# Patient Record
Sex: Male | Born: 1995 | Race: White | Hispanic: No | Marital: Married | Smoking: Never smoker
Health system: Southern US, Community
[De-identification: ages and names within clinical notes are randomized; demographics above are authoritative.]

## PROBLEM LIST (undated history)

## (undated) DIAGNOSIS — Z8659 Personal history of other mental and behavioral disorders: Secondary | ICD-10-CM

## (undated) DIAGNOSIS — E663 Overweight: Secondary | ICD-10-CM

## (undated) HISTORY — DX: Overweight: E66.3

## (undated) HISTORY — DX: Personal history of other mental and behavioral disorders: Z86.59

---
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
## 2014-09-21 ENCOUNTER — Ambulatory Visit: Payer: Self-pay | Admitting: Unknown Physician Specialty

## 2014-09-23 ENCOUNTER — Emergency Department: Payer: Self-pay | Admitting: Emergency Medicine

## 2014-09-23 LAB — CBC WITH DIFFERENTIAL/PLATELET
BASOS ABS: 0 10*3/uL (ref 0.0–0.1)
BASOS PCT: 0.1 %
EOS ABS: 0 10*3/uL (ref 0.0–0.7)
Eosinophil %: 0.1 %
HCT: 49 % (ref 40.0–52.0)
HGB: 16.4 g/dL (ref 13.0–18.0)
LYMPHS ABS: 2.1 10*3/uL (ref 1.0–3.6)
Lymphocyte %: 15.1 %
MCH: 29.9 pg (ref 26.0–34.0)
MCHC: 33.4 g/dL (ref 32.0–36.0)
MCV: 89 fL (ref 80–100)
Monocyte #: 1.4 x10 3/mm — ABNORMAL HIGH (ref 0.2–1.0)
Monocyte %: 10.2 %
NEUTROS ABS: 10.5 10*3/uL — AB (ref 1.4–6.5)
Neutrophil %: 74.5 %
PLATELETS: 290 10*3/uL (ref 150–440)
RBC: 5.49 10*6/uL (ref 4.40–5.90)
RDW: 13.1 % (ref 11.5–14.5)
WBC: 14.2 10*3/uL — ABNORMAL HIGH (ref 3.8–10.6)

## 2014-09-23 LAB — COMPREHENSIVE METABOLIC PANEL
ALBUMIN: 4.2 g/dL (ref 3.8–5.6)
ALT: 29 U/L
Alkaline Phosphatase: 64 U/L
Anion Gap: 5 — ABNORMAL LOW (ref 7–16)
BILIRUBIN TOTAL: 2.6 mg/dL — AB (ref 0.2–1.0)
BUN: 9 mg/dL (ref 7–18)
CO2: 31 mmol/L (ref 21–32)
CREATININE: 1.1 mg/dL (ref 0.60–1.30)
Calcium, Total: 9.2 mg/dL (ref 9.0–10.7)
Chloride: 101 mmol/L (ref 98–107)
EGFR (African American): >60
EGFR (Non-African Amer.): >60
Glucose: 89 mg/dL (ref 65–99)
Osmolality: 272 (ref 275–301)
POTASSIUM: 3.8 mmol/L (ref 3.5–5.1)
SGOT(AST): 24 U/L (ref 10–41)
Sodium: 137 mmol/L (ref 136–145)
Total Protein: 8.1 g/dL (ref 6.4–8.6)

## 2015-01-18 LAB — SURGICAL PATHOLOGY

## 2015-05-25 DIAGNOSIS — F988 Other specified behavioral and emotional disorders with onset usually occurring in childhood and adolescence: Secondary | ICD-10-CM | POA: Insufficient documentation

## 2019-05-05 ENCOUNTER — Ambulatory Visit: Payer: Self-pay | Admitting: Internal Medicine

## 2019-05-13 ENCOUNTER — Encounter: Payer: Self-pay | Admitting: Internal Medicine

## 2019-05-13 ENCOUNTER — Ambulatory Visit: Payer: 59 | Admitting: Internal Medicine

## 2019-05-13 ENCOUNTER — Other Ambulatory Visit: Payer: Self-pay

## 2019-05-13 VITALS — BP 122/74 | HR 72 | Temp 97.8°F | Resp 16 | Ht 70.0 in | Wt 253.0 lb

## 2019-05-13 DIAGNOSIS — B356 Tinea cruris: Secondary | ICD-10-CM

## 2019-05-13 DIAGNOSIS — R21 Rash and other nonspecific skin eruption: Secondary | ICD-10-CM | POA: Insufficient documentation

## 2019-05-13 MED ORDER — CLOTRIMAZOLE-BETAMETHASONE 1-0.05 % EX CREA: 1.0000 "application " | TOPICAL_CREAM | Freq: Two times a day (BID) | CUTANEOUS | 1 refills | Status: DC

## 2019-05-13 NOTE — Progress Notes (Signed)
S - Patient presents with skin rash, groin area Started 2 weeks ago, was getting better, and then worsened again last few days Redness noted on upper inner thighs and groin Itchy and more recently burns some and stings No doc'ed fevers, has not felt feverish, not feeling ill and no Covid concerning sx's Has applied many products- gold bond, tinactin, a steroid cream  No Known Allergies   No current outpatient medications on file prior to visit.   No current facility-administered medications on file prior to visit.     Former tob user    O - NAD, not ill appearing, masked   BP 122/74 (BP Location: Right Arm, Patient Position: Sitting, Cuff Size: Large)   Pulse 72   Temp 97.8 F (36.6 C) (Oral)   Resp 16   Ht 5\' 10"  (1.778 m)   Wt 253 lb (114.8 kg)   SpO2 98%   BMI 36.30 kg/m    HEENT - sclera ancteric,  Skin - erythematous lesions noted on both upper thighs and into the groin andtowards the lower scrotum with mild involvement of the base of the scrotum, with the erythema mostly confluent and some satellite lesions noted on the left thigh> right thigh, + scaliness  Affect not flat, approp with conversation,      Ass/Plan - 1. Dermatitis - Tinea cruris with inflammation  Educated, info included in AVS Lotrisone generic - apply topically twice daily sparingly to clean areas after showering  Noted will take weeks not days to fully resolve and he understood  Loose fitting boxers best, avoid compression shorts Liberal use of powders as improving recommended regularly to help prevent recurrences Follow-up if not better or worsening,

## 2019-05-13 NOTE — Progress Notes (Signed)
Rash 2 weeks.  Tried Corizone cream, Gold Bond, Tinactin & Lotrimin Ultra without relief.  States got worse when tried Lotrimin Ultra.  States it burns - even when showers like a sunburn.

## 2019-05-13 NOTE — Patient Instructions (Signed)
Jock Itch Jock itch (tinea cruris) is an infection of the skin in the groin area. It is caused by a fungus, which is a type of germ that lives in dark, damp places. Jock itch causes an itchy rash in the groin and upper thigh area. It usually goes away in 2-3 weeks with treatment. What are the causes? The fungus that causes jock itch may be spread by:  Touching a fungus infection elsewhere on your body, such as athlete's foot, and then touching your groin area.  Sharing towels or clothing, such as socks or shoes, with someone who has a fungal infection. What increases the risk? Jock itch is most common in men and adolescent boys. You are also more likely to develop the condition if you:  Are in a hot, humid climate.  Wear tight-fitting clothing or wet bathing suits for long periods of time.  Play sports.  Are overweight.  Have diabetes.  Have a weakened immune system.  Sweat a lot. What are the signs or symptoms? Symptoms of jock itch may include:  A red, pink, or brown rash in the groin area. Blisters may be present. The rash may spread to the thighs, anus, and buttocks.  Dry and scaly skin on or around the rash.  Itchiness. How is this diagnosed? In most cases, your health care provider can make the diagnosis by looking at your rash. In some cases, a sample of infected skin may be scraped off. This sample may be examined under a microscope (biopsy) or by trying to grow the fungus from the sample (culture). How is this treated? Treatment for this condition may include:  Antifungal medicine to kill the fungus. This may be a skin cream, ointment, or powder, or it may be a medicine that you take by mouth.  Skin cream or ointment to reduce itching.  Lifestyle changes, such as wearing looser clothing and caring for your skin. Follow these instructions at home: Skin care  Apply skin creams, ointments, or powders exactly as told by your health care provider.  Wear  loose-fitting clothing that does not rub against your groin area. Men should wear boxer shorts or loose-fitting underwear.  Keep your groin area clean and dry. ? Change your underwear every day. ? Change out of wet bathing suits as soon as possible. ? After bathing, use a separate towel to dry your groin area thoroughly and gently. Using a separate towel will help prevent spreading the infection to other areas of your body.  Avoid hot baths and showers. Hot water can make itching worse.  Do not scratch the affected area. General instructions  Take and apply over-the-counter and prescription medicines only as told by your health care provider.  Do not share towels, clothing, or personal items with other people.  Wash your hands often with soap and water, especially after touching your groin area. If soap and water are not available, use alcohol-based hand sanitizer. Contact a health care provider if:  Your rash: ? Gets worse or does not get better after 2 weeks of treatment. ? Spreads. ? Returns after treatment is finished.  You have any of the following: ? A fever. ? New or worsening redness, swelling, or pain around your rash. ? Fluid, blood, or pus coming from your rash. Summary  Jock itch (tinea cruris) is a fungal infection of the skin in the groin area.  The fungus can be spread by sharing clothing or by touching a fungus infection elsewhere on your body and   then touching your groin area.  Treatment may include antifungal medicine and lifestyle changes, such as keeping the area clean and dry. This information is not intended to replace advice given to you by your health care provider. Make sure you discuss any questions you have with your health care provider. Document Released: 09/01/2002 Document Revised: 08/24/2017 Document Reviewed: 08/22/2017 Elsevier Patient Education  2020 Elsevier Inc[employer]  

## 2019-11-26 DIAGNOSIS — J069 Acute upper respiratory infection, unspecified: Secondary | ICD-10-CM | POA: Diagnosis not present

## 2019-11-26 DIAGNOSIS — J0110 Acute frontal sinusitis, unspecified: Secondary | ICD-10-CM | POA: Diagnosis not present

## 2019-11-26 DIAGNOSIS — Z20828 Contact with and (suspected) exposure to other viral communicable diseases: Secondary | ICD-10-CM | POA: Diagnosis not present

## 2019-11-26 DIAGNOSIS — Z03818 Encounter for observation for suspected exposure to other biological agents ruled out: Secondary | ICD-10-CM | POA: Diagnosis not present

## 2020-02-17 NOTE — Progress Notes (Signed)
Scheduled to complete physical 02/26/20.  (Provider TBD)  AMD 

## 2020-02-18 ENCOUNTER — Other Ambulatory Visit: Payer: Self-pay

## 2020-02-18 ENCOUNTER — Ambulatory Visit: Payer: Self-pay

## 2020-02-18 DIAGNOSIS — Z0000 Encounter for general adult medical examination without abnormal findings: Secondary | ICD-10-CM

## 2020-02-18 LAB — POCT URINALYSIS DIPSTICK
Bilirubin, UA: NEGATIVE
Blood, UA: NEGATIVE
Glucose, UA: NEGATIVE
Ketones, UA: NEGATIVE
Leukocytes, UA: NEGATIVE
Nitrite, UA: NEGATIVE
Protein, UA: NEGATIVE
Spec Grav, UA: 1.025 (ref 1.010–1.025)
Urobilinogen, UA: 0.2 E.U./dL
pH, UA: 6 (ref 5.0–8.0)

## 2020-02-19 LAB — CMP12+LP+TP+TSH+6AC+CBC/D/PLT
ALT: 17 IU/L (ref 0–44)
AST: 17 IU/L (ref 0–40)
Albumin/Globulin Ratio: 1.9 (ref 1.2–2.2)
Albumin: 4.6 g/dL (ref 4.1–5.2)
Alkaline Phosphatase: 55 IU/L (ref 48–121)
BUN/Creatinine Ratio: 17 (ref 9–20)
BUN: 14 mg/dL (ref 6–20)
Basophils Absolute: 0 10*3/uL (ref 0.0–0.2)
Basos: 1 %
Bilirubin Total: 0.6 mg/dL (ref 0.0–1.2)
Calcium: 9.4 mg/dL (ref 8.7–10.2)
Chloride: 103 mmol/L (ref 96–106)
Chol/HDL Ratio: 3.3 ratio (ref 0.0–5.0)
Cholesterol, Total: 137 mg/dL (ref 100–199)
Creatinine, Ser: 0.83 mg/dL (ref 0.76–1.27)
EOS (ABSOLUTE): 0.1 10*3/uL (ref 0.0–0.4)
Eos: 1 %
Estimated CHD Risk: <0.5 times avg. (ref 0.0–1.0)
Free Thyroxine Index: 2.3 (ref 1.2–4.9)
GFR calc Af Amer: 141 mL/min/{1.73_m2} (ref >59)
GFR calc non Af Amer: 122 mL/min/{1.73_m2} (ref >59)
GGT: 10 IU/L (ref 0–65)
Globulin, Total: 2.4 g/dL (ref 1.5–4.5)
Glucose: 89 mg/dL (ref 65–99)
HDL: 42 mg/dL (ref >39)
Hematocrit: 48.8 % (ref 37.5–51.0)
Hemoglobin: 16.4 g/dL (ref 13.0–17.7)
Immature Grans (Abs): 0 10*3/uL (ref 0.0–0.1)
Immature Granulocytes: 0 %
Iron: 80 ug/dL (ref 38–169)
LDH: 166 IU/L (ref 121–224)
LDL Chol Calc (NIH): 79 mg/dL (ref 0–99)
Lymphocytes Absolute: 1.9 10*3/uL (ref 0.7–3.1)
Lymphs: 30 %
MCH: 29.7 pg (ref 26.6–33.0)
MCHC: 33.6 g/dL (ref 31.5–35.7)
MCV: 88 fL (ref 79–97)
Monocytes Absolute: 0.7 10*3/uL (ref 0.1–0.9)
Monocytes: 11 %
Neutrophils Absolute: 3.6 10*3/uL (ref 1.4–7.0)
Neutrophils: 57 %
Phosphorus: 3 mg/dL (ref 2.8–4.1)
Platelets: 313 10*3/uL (ref 150–450)
Potassium: 4.7 mmol/L (ref 3.5–5.2)
RBC: 5.52 x10E6/uL (ref 4.14–5.80)
RDW: 12.4 % (ref 11.6–15.4)
Sodium: 139 mmol/L (ref 134–144)
T3 Uptake Ratio: 34 % (ref 24–39)
T4, Total: 6.7 ug/dL (ref 4.5–12.0)
TSH: 2.39 u[IU]/mL (ref 0.450–4.500)
Total Protein: 7 g/dL (ref 6.0–8.5)
Triglycerides: 81 mg/dL (ref 0–149)
Uric Acid: 5.4 mg/dL (ref 3.8–8.4)
VLDL Cholesterol Cal: 16 mg/dL (ref 5–40)
WBC: 6.3 10*3/uL (ref 3.4–10.8)

## 2020-02-26 ENCOUNTER — Other Ambulatory Visit: Payer: Self-pay

## 2020-02-26 ENCOUNTER — Encounter: Payer: Self-pay | Admitting: Physician Assistant

## 2020-02-26 ENCOUNTER — Ambulatory Visit: Payer: Self-pay | Admitting: Physician Assistant

## 2020-02-26 VITALS — BP 116/70 | HR 70 | Temp 97.3°F | Resp 12 | Ht 70.0 in | Wt 249.0 lb

## 2020-02-26 DIAGNOSIS — B079 Viral wart, unspecified: Secondary | ICD-10-CM

## 2020-02-26 DIAGNOSIS — Z0000 Encounter for general adult medical examination without abnormal findings: Secondary | ICD-10-CM

## 2020-02-26 NOTE — Progress Notes (Signed)
   Subjective: Annual physical    Patient ID: Russell Jacobs, male    DOB: [birth year], 24 y.o.   MRN: [id number]  HPI Patient presents for annual physical.  Patient was concerned secondary to increase in amount of warts to bilateral hand.   Review of Systems    Eczema and warts. Objective:   Physical Exam No acute distress.  HEENT was unremarkable.  Neck is supple for adenopathy or bruits.  Lungs are clear to auscultation.  Heart regular rate and rhythm.  Abdomen with negative HSM, normoactive bowel sounds, soft, nontender to palpation.  No obvious deformity to the upper extremity or lower extremities.  Patient has full and equal range of motion to the upper and lower extremities.  No obvious deformity to cervical lumbar spine.  Patient has full neck range of motion cervical lumbar spine.  Patient has multiple warts bilateral hand.  Cranial nerves II through XII are grossly intact.       Assessment & Plan: Well exam.  Discussed no acute findings on lab results with patient.  Recommend consult to dermatology for further evaluation and treatment of warts.

## 2020-02-26 NOTE — Addendum Note (Signed)
Addended by: Gardner Candle on: 02/26/2020 03:22 PM   Modules accepted: Orders

## 2020-03-02 DIAGNOSIS — B078 Other viral warts: Secondary | ICD-10-CM | POA: Diagnosis not present

## 2020-03-02 DIAGNOSIS — L308 Other specified dermatitis: Secondary | ICD-10-CM | POA: Diagnosis not present

## 2020-03-02 DIAGNOSIS — R238 Other skin changes: Secondary | ICD-10-CM | POA: Diagnosis not present

## 2020-03-02 DIAGNOSIS — R208 Other disturbances of skin sensation: Secondary | ICD-10-CM | POA: Diagnosis not present

## 2020-03-15 DIAGNOSIS — Z03818 Encounter for observation for suspected exposure to other biological agents ruled out: Secondary | ICD-10-CM | POA: Diagnosis not present

## 2020-03-15 DIAGNOSIS — R197 Diarrhea, unspecified: Secondary | ICD-10-CM | POA: Diagnosis not present

## 2020-03-15 DIAGNOSIS — R109 Unspecified abdominal pain: Secondary | ICD-10-CM | POA: Diagnosis not present

## 2020-03-15 DIAGNOSIS — W57XXXA Bitten or stung by nonvenomous insect and other nonvenomous arthropods, initial encounter: Secondary | ICD-10-CM | POA: Diagnosis not present

## 2020-03-15 DIAGNOSIS — R1084 Generalized abdominal pain: Secondary | ICD-10-CM | POA: Diagnosis not present

## 2020-03-16 DIAGNOSIS — R197 Diarrhea, unspecified: Secondary | ICD-10-CM | POA: Diagnosis not present

## 2020-03-16 DIAGNOSIS — R1084 Generalized abdominal pain: Secondary | ICD-10-CM | POA: Diagnosis not present

## 2020-04-26 DIAGNOSIS — B078 Other viral warts: Secondary | ICD-10-CM | POA: Diagnosis not present

## 2020-04-26 DIAGNOSIS — R238 Other skin changes: Secondary | ICD-10-CM | POA: Diagnosis not present

## 2020-04-27 ENCOUNTER — Other Ambulatory Visit: Payer: Self-pay

## 2020-04-27 ENCOUNTER — Encounter: Payer: Self-pay | Admitting: Emergency Medicine

## 2020-04-27 ENCOUNTER — Ambulatory Visit: Payer: Self-pay | Admitting: Emergency Medicine

## 2020-04-27 VITALS — BP 113/69 | HR 66 | Temp 96.5°F | Resp 14 | Ht 70.0 in | Wt 243.0 lb

## 2020-04-27 DIAGNOSIS — F909 Attention-deficit hyperactivity disorder, unspecified type: Secondary | ICD-10-CM

## 2020-04-27 NOTE — Progress Notes (Signed)
Occupational Health Provider Note       Time seen: 4:21 PM    I have reviewed the vital signs and the nursing notes.  HISTORY   Chief Complaint Drug Problem and ADD/ADHD History    HPI Russell Jacobs is a 24 y.o. male with a history of ADHD, obesity who presents today for a medication refill.  Patient has been off of Adderall for several years but is about to go back to school and is requesting it to help him concentrate.  Past Medical History:  Diagnosis Date  . History of ADHD    Stopped taking medication when went into [employer]  . Overweight     History reviewed. No pertinent surgical history.  Allergies Patient has no known allergies.   Review of Systems Constitutional: Negative for fever. Cardiovascular: Negative for chest pain. Respiratory: Negative for shortness of breath. Gastrointestinal: Negative for abdominal pain, vomiting and diarrhea. Musculoskeletal: Negative for back pain. Skin: Negative for rash. Neurological: Negative for headaches, focal weakness or numbness.   All systems negative/normal/unremarkable except as stated in the HPI  ____________________________________________   PHYSICAL EXAM:  VITAL SIGNS: Vitals:   04/27/20 1603  BP: 113/69  Pulse: 66  Resp: 14  Temp: (!) 96.5 F (35.8 C)  SpO2: 99%    Constitutional: Alert and oriented. Well appearing and in no distress. Eyes: Conjunctivae are normal. Normal extraocular movements. Cardiovascular: Normal rate, regular rhythm. No murmurs, rubs, or gallops. Respiratory: Normal respiratory effort without tachypnea nor retractions. Breath sounds are clear and equal bilaterally. No wheezes/rales/rhonchi. Musculoskeletal: Nontender with normal range of motion in extremities. No lower extremity tenderness nor edema. Neurologic:  Normal speech and language. No gross focal neurologic deficits are appreciated.  Skin:  Skin is warm, dry and intact. No rash noted. Psychiatric: Speech and  behavior are normal.  ____________________________________________   LABS (pertinent positives/negatives)  Recent Results (from the past 2160 hour(s))  CMP12+LP+TP+TSH+6AC+CBC/D/Plt     Status: None   Collection Time: 02/18/20  8:42 AM  Result Value Ref Range   Glucose 89 65 - 99 mg/dL   Uric Acid 5.4 3.8 - 8.4 mg/dL    Comment:            Therapeutic target for gout patients: <6.0   BUN 14 6 - 20 mg/dL   Creatinine, Ser 0.83 0.76 - 1.27 mg/dL   GFR calc non Af Amer 122 >59 mL/min/1.73   GFR calc Af Amer 141 >59 mL/min/1.73    Comment: **[facility] currently reports eGFR in compliance with the current**   recommendations of the [employer]. [facility] will   update reporting as new guidelines are published from the NKF-ASN   Task force.    BUN/Creatinine Ratio 17 9 - 20   Sodium 139 134 - 144 mmol/L   Potassium 4.7 3.5 - 5.2 mmol/L   Chloride 103 96 - 106 mmol/L   Calcium 9.4 8.7 - 10.2 mg/dL   Phosphorus 3.0 2.8 - 4.1 mg/dL   Total Protein 7.0 6.0 - 8.5 g/dL   Albumin 4.6 4.1 - 5.2 g/dL   Globulin, Total 2.4 1.5 - 4.5 g/dL   Albumin/Globulin Ratio 1.9 1.2 - 2.2   Bilirubin Total 0.6 0.0 - 1.2 mg/dL   Alkaline Phosphatase 55 48 - 121 IU/L    Comment:               **Please note reference interval change**   LDH 166 121 - 224 IU/L   AST 17 0 -  40 IU/L   ALT 17 0 - 44 IU/L   GGT 10 0 - 65 IU/L   Iron 80 38 - 169 ug/dL   Cholesterol, Total 137 100 - 199 mg/dL   Triglycerides 81 0 - 149 mg/dL   HDL 42 >39 mg/dL   VLDL Cholesterol Cal 16 5 - 40 mg/dL   LDL Chol Calc (NIH) 79 0 - 99 mg/dL   Chol/HDL Ratio 3.3 0.0 - 5.0 ratio    Comment:                                   T. Chol/HDL Ratio                                             Men  Women                               1/2 Avg.Risk  3.4    3.3                                   Avg.Risk  5.0    4.4                                2X Avg.Risk  9.6    7.1                                3X Avg.Risk 23.4   11.0     Estimated CHD Risk  < 0.5 0.0 - 1.0 times avg.    Comment: The CHD Risk is based on the T. Chol/HDL ratio. Other factors affect CHD Risk such as hypertension, smoking, diabetes, severe obesity, and family history of premature CHD.    TSH 2.390 0.450 - 4.500 uIU/mL   T4, Total 6.7 4.5 - 12.0 ug/dL   T3 Uptake Ratio 34 24 - 39 %   Free Thyroxine Index 2.3 1.2 - 4.9   WBC 6.3 3.4 - 10.8 x10E3/uL   RBC 5.52 4.14 - 5.80 x10E6/uL   Hemoglobin 16.4 13.0 - 17.7 g/dL   Hematocrit 48.8 37.5 - 51.0 %   MCV 88 79 - 97 fL   MCH 29.7 26.6 - 33.0 pg   MCHC 33.6 31 - 35 g/dL   RDW 12.4 11.6 - 15.4 %   Platelets 313 150 - 450 x10E3/uL   Neutrophils 57 Not Estab. %   Lymphs 30 Not Estab. %   Monocytes 11 Not Estab. %   Eos 1 Not Estab. %   Basos 1 Not Estab. %   Neutrophils Absolute 3.6 1 - 7 x10E3/uL   Lymphocytes Absolute 1.9 0 - 3 x10E3/uL   Monocytes Absolute 0.7 0 - 0 x10E3/uL   EOS (ABSOLUTE) 0.1 0.0 - 0.4 x10E3/uL   Basophils Absolute 0.0 0 - 0 x10E3/uL   Immature Granulocytes 0 Not Estab. %   Immature Grans (Abs) 0.0 0.0 - 0.1 x10E3/uL  POCT urinalysis dipstick     Status: None   Collection Time: 02/18/20 10:09 AM  Result Value Ref Range  Color, UA dark yellow    Clarity, UA clear    Glucose, UA Negative Negative   Bilirubin, UA negative    Ketones, UA negative    Spec Grav, UA 1.025 1.010 - 1.025   Blood, UA negative    pH, UA 6.0 5.0 - 8.0   Protein, UA Negative Negative   Urobilinogen, UA 0.2 0.2 or 1.0 E.U./dL   Nitrite, UA negative    Leukocytes, UA Negative Negative   Appearance     Odor      ASSESSMENT AND PLAN  ADHD   Plan: The patient had presented for Adderall refill.  Patient denies any complaints, I will start him on low-dose Adderall.  He is cleared for follow-up as needed.  Lenise Arena MD    Note: This note was generated in part or whole with voice recognition software. Voice recognition is usually quite accurate but there are transcription  errors that can and very often do occur. I apologize for any typographical errors that were not detected and corrected.

## 2020-04-28 ENCOUNTER — Telehealth: Payer: Self-pay | Admitting: Emergency Medicine

## 2020-04-28 NOTE — Telephone Encounter (Signed)
Hand written prescription

## 2020-05-21 ENCOUNTER — Other Ambulatory Visit: Payer: Self-pay

## 2020-05-21 DIAGNOSIS — F909 Attention-deficit hyperactivity disorder, unspecified type: Secondary | ICD-10-CM

## 2020-05-24 DIAGNOSIS — R208 Other disturbances of skin sensation: Secondary | ICD-10-CM | POA: Diagnosis not present

## 2020-05-24 DIAGNOSIS — B078 Other viral warts: Secondary | ICD-10-CM | POA: Diagnosis not present

## 2020-05-24 MED ORDER — ADDERALL XR 20 MG PO CP24: 20.0000 mg | ORAL_CAPSULE | Freq: Every day | ORAL | 0 refills | Status: DC

## 2020-07-02 ENCOUNTER — Other Ambulatory Visit: Payer: Self-pay

## 2020-07-02 DIAGNOSIS — F909 Attention-deficit hyperactivity disorder, unspecified type: Secondary | ICD-10-CM

## 2020-07-02 MED ORDER — ADDERALL XR 20 MG PO CP24: 20.0000 mg | ORAL_CAPSULE | Freq: Every day | ORAL | 0 refills | Status: DC

## 2020-07-05 DIAGNOSIS — B078 Other viral warts: Secondary | ICD-10-CM | POA: Diagnosis not present

## 2020-07-05 DIAGNOSIS — R238 Other skin changes: Secondary | ICD-10-CM | POA: Diagnosis not present

## 2020-07-05 DIAGNOSIS — L538 Other specified erythematous conditions: Secondary | ICD-10-CM | POA: Diagnosis not present

## 2020-07-12 DIAGNOSIS — G8929 Other chronic pain: Secondary | ICD-10-CM | POA: Diagnosis not present

## 2020-07-12 DIAGNOSIS — Z8379 Family history of other diseases of the digestive system: Secondary | ICD-10-CM | POA: Diagnosis not present

## 2020-07-12 DIAGNOSIS — R109 Unspecified abdominal pain: Secondary | ICD-10-CM | POA: Diagnosis not present

## 2020-07-12 DIAGNOSIS — Z8619 Personal history of other infectious and parasitic diseases: Secondary | ICD-10-CM | POA: Diagnosis not present

## 2020-07-12 DIAGNOSIS — K529 Noninfective gastroenteritis and colitis, unspecified: Secondary | ICD-10-CM | POA: Diagnosis not present

## 2020-07-13 DIAGNOSIS — K529 Noninfective gastroenteritis and colitis, unspecified: Secondary | ICD-10-CM | POA: Diagnosis not present

## 2020-07-13 DIAGNOSIS — R109 Unspecified abdominal pain: Secondary | ICD-10-CM | POA: Diagnosis not present

## 2020-07-13 DIAGNOSIS — G8929 Other chronic pain: Secondary | ICD-10-CM | POA: Diagnosis not present

## 2020-07-19 ENCOUNTER — Emergency Department: Payer: Self-pay

## 2020-07-19 ENCOUNTER — Other Ambulatory Visit: Payer: Self-pay

## 2020-07-19 ENCOUNTER — Ambulatory Visit: Payer: Self-pay | Admitting: Physician Assistant

## 2020-07-19 ENCOUNTER — Emergency Department
Admission: EM | Admit: 2020-07-19 | Discharge: 2020-07-19 | Disposition: A | Discharge status: Home / Self Care | Payer: No Typology Code available for payment source | Attending: Emergency Medicine | Admitting: Emergency Medicine

## 2020-07-19 ENCOUNTER — Emergency Department: Payer: No Typology Code available for payment source

## 2020-07-19 ENCOUNTER — Encounter: Payer: Self-pay | Admitting: Emergency Medicine

## 2020-07-19 VITALS — BP 119/84 | HR 82 | Temp 98.0°F | Resp 14 | Ht 70.0 in | Wt 228.0 lb

## 2020-07-19 DIAGNOSIS — W010XXA Fall on same level from slipping, tripping and stumbling without subsequent striking against object, initial encounter: Secondary | ICD-10-CM | POA: Diagnosis not present

## 2020-07-19 DIAGNOSIS — S93602A Unspecified sprain of left foot, initial encounter: Secondary | ICD-10-CM | POA: Diagnosis not present

## 2020-07-19 DIAGNOSIS — S9032XA Contusion of left foot, initial encounter: Secondary | ICD-10-CM | POA: Insufficient documentation

## 2020-07-19 DIAGNOSIS — Z87891 Personal history of nicotine dependence: Secondary | ICD-10-CM | POA: Insufficient documentation

## 2020-07-19 DIAGNOSIS — M79672 Pain in left foot: Secondary | ICD-10-CM | POA: Diagnosis present

## 2020-07-19 NOTE — Discharge Instructions (Signed)
Follow-up with orthopedics if not improving in 3 to 5 days.  Or orthopedics of Worker's Comp. choice.  Apply ice and elevate the foot.  Take over-the-counter Advil or Aleve.  You may also take Tylenol combined with these.  Return emergency department for worsening

## 2020-07-19 NOTE — ED Notes (Signed)
Workers comp completed using pt's DL. This tech logged the specimen in lab for transport.

## 2020-07-19 NOTE — Progress Notes (Signed)
   Subjective: Left foot pain    Patient ID: Russell Jacobs, male    DOB: 1996-02-19, 24 y.o.   MRN: [id number]  HPI Patient presents with left foot pain secondary to a "low-grade" accident which occurred this morning at work.  Patient pain increased with weightbearing and he cannot "curl" his toes secondary to the injury.  Denies loss of sensation.   Review of Systems    ADHD Objective:   Physical Exam No acute distress.  Examination of foot shows no obvious deformity.  There is mild edema to the lateral aspect of the left foot.  Patient is moderate guarding palpation at the fourth and fifth metatarsal.       Assessment & Plan: Foot pain  Advised the patient different evaluation with an x-ray is warranted.  Patient to go to the emergency room for definitive evaluation and treatment.

## 2020-07-19 NOTE — Progress Notes (Signed)
Pt presents today with an accident at work with rolling his left foot. Pt states top of foot hurts and cant curl his toes under. CL,RMA

## 2020-07-19 NOTE — ED Notes (Signed)
Pt with bruising top of left foot from injury. Pain when walking.

## 2020-07-19 NOTE — ED Provider Notes (Signed)
[facility] Emergency Department Provider Note  ____________________________________________   First MD Initiated Contact with Patient 07/19/20 1632     (approximate)  I have reviewed the triage vital signs and the nursing notes.   HISTORY  Chief Complaint Foot Pain    HPI Russell Jacobs is a 24 y.o. male presents emergency department complaining of left foot pain.  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  Injury happened today.  Swelling and bruising to the area near his toes.  States he does not have any ankle pain just foot pain.  No other injuries reported    Past Medical History:  Diagnosis Date  . History of ADHD    Stopped taking medication when went into [employer]  . Overweight     Patient Active Problem List   Diagnosis Date Noted  . Rash 05/13/2019  . ADD (attention deficit disorder) 05/25/2015    History reviewed. No pertinent surgical history.  Prior to Admission medications   Medication Sig Start Date End Date Taking? Authorizing Provider  ADDERALL XR 20 MG 24 hr capsule Take 1 capsule (20 mg total) by mouth daily. 07/02/20   Joni Reining, PA-C  clotrimazole-betamethasone (LOTRISONE) cream Apply 1 application topically 2 (two) times daily. Apply sparingly 05/13/19   Jamelle Haring, MD  fluorouracil (EFUDEX) 5 % cream  07/05/20   [provider]  fluticasone (FLONASE) 50 MCG/ACT nasal spray Place into the nose. 11/26/19   [provider]    Allergies Patient has no known allergies.  Family History  Problem Relation Age of Onset  . Crohn's disease Father     Social History Social History   Tobacco Use  . Smoking status: Former Games developer  . Smokeless tobacco: Former Clinical biochemist  . Vaping Use: Never used  Substance Use Topics  . Alcohol use: Not Currently  . Drug use: Not on file    Review of  Systems  Constitutional: No fever/chills Eyes: No visual changes. ENT: No sore throat. Respiratory: Denies cough Genitourinary: Negative for dysuria. Musculoskeletal: Negative for back pain.  Positive for left foot pain Skin: Negative for rash. Psychiatric: no mood changes,     ____________________________________________   PHYSICAL EXAM:  VITAL SIGNS: ED Triage Vitals  Enc Vitals Group     BP 07/19/20 1619 120/71     Pulse Rate 07/19/20 1619 87     Resp 07/19/20 1619 20     Temp 07/19/20 1619 99 F (37.2 C)     Temp Source 07/19/20 1619 Oral     SpO2 07/19/20 1619 99 %     Weight 07/19/20 1620 228 lb (103.4 kg)     Height 07/19/20 1620 5\' 10"  (1.778 m)     Head Circumference --      Peak Flow --      Pain Score 07/19/20 1619 4     Pain Loc --      Pain Edu? --      Excl. in GC? --     Constitutional: Alert and oriented. Well appearing and in no acute distress. Eyes: Conjunctivae are normal.  Head: Atraumatic. Nose: No congestion/rhinnorhea. Mouth/Throat: Mucous membranes are moist.   Neck:  supple no lymphadenopathy noted Cardiovascular: Normal rate, regular rhythm. Respiratory: Normal respiratory effort.  No retractions GU: deferred Musculoskeletal: FROM all extremities, warm and well perfused, left ankle is not tender,  left knee is not tender, left foot is tender distally along the distal metatarsals, minimal swelling noted, neurovascular is intact Neurologic:  Normal speech and language.  Skin:  Skin is warm, dry and intact. No rash noted. Psychiatric: Mood and affect are normal. Speech and behavior are normal.  ____________________________________________   LABS (all labs ordered are listed, but only abnormal results are displayed)  Labs Reviewed - No data to display ____________________________________________   ____________________________________________  RADIOLOGY  X-ray of the left  foot  ____________________________________________   PROCEDURES  Procedure(s) performed: Postop shoe applied by nursing staff   Procedures    ____________________________________________   INITIAL IMPRESSION / ASSESSMENT AND PLAN / ED COURSE  Pertinent labs & imaging results that were available during my care of the patient were reviewed by me and considered in my medical decision making (see chart for details).   Patient is 24 year old male presents emergency department Worker's Comp. injury of his left foot.  See HPI.  Physical exam shows the left foot to be tender at the distal portion of the metatarsals.  X-ray of the left foot to rule out fracture   I reviewed the x-ray, x-ray results are negative for fracture  Patiently placed in a postop shoe for comfort.  Light duty for 4 days.  Elevate and ice.  Take over-the-counter Advil and Tylenol.  Return if worsening.  Follow-up with orthopedics if not improving in 3 to 4 days  [name] was evaluated in Emergency Department on 07/19/2020 for the symptoms described in the history of present illness. He was evaluated in the context of the global COVID-19 pandemic, which necessitated consideration that the patient might be at risk for infection with the SARS-CoV-2 virus that causes COVID-19. Institutional protocols and algorithms that pertain to the evaluation of patients at risk for COVID-19 are in a state of rapid change based on information released by regulatory bodies including the CDC and federal and state organizations. These policies and algorithms were followed during the patient's care in the ED.    As part of my medical decision making, I reviewed the following data within the electronic MEDICAL RECORD NUMBER Nursing notes reviewed and incorporated, Old chart reviewed, Radiograph reviewed , Notes from prior ED visits and [place] Controlled Substance Database  ____________________________________________   FINAL CLINICAL  IMPRESSION(S) / ED DIAGNOSES  Final diagnoses:  Sprain of left foot, initial encounter      NEW MEDICATIONS STARTED DURING THIS VISIT:  New Prescriptions   No medications on file     Note:  This document was prepared using Dragon voice recognition software and may include unintentional dictation errors.    Faythe Ghee, PA-C 07/19/20 1751    Shaune Pollack, MD 07/20/20 0130

## 2020-07-19 NOTE — ED Triage Notes (Signed)
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 Pt ambulatory with steady gait at this time, c/o pain 4/10. Pt states is filing as a WC.

## 2020-07-22 ENCOUNTER — Ambulatory Visit: Payer: Self-pay | Admitting: Podiatry

## 2020-08-09 ENCOUNTER — Other Ambulatory Visit: Payer: Self-pay

## 2020-08-09 DIAGNOSIS — F909 Attention-deficit hyperactivity disorder, unspecified type: Secondary | ICD-10-CM

## 2020-08-12 ENCOUNTER — Telehealth: Payer: Self-pay

## 2020-08-12 NOTE — Telephone Encounter (Signed)
Order already in Epic for [employer], PA-C -- it was sent to him by Christianne Dolin, CMA.  AMD

## 2020-08-14 MED ORDER — ADDERALL XR 20 MG PO CP24: 20.0000 mg | ORAL_CAPSULE | Freq: Every day | ORAL | 0 refills | Status: DC

## 2020-09-06 ENCOUNTER — Other Ambulatory Visit: Payer: Self-pay

## 2020-09-06 DIAGNOSIS — Z1152 Encounter for screening for COVID-19: Secondary | ICD-10-CM

## 2020-09-08 LAB — NOVEL CORONAVIRUS, NAA: SARS-CoV-2, NAA: DETECTED — AB

## 2020-09-08 LAB — SARS-COV-2, NAA 2 DAY TAT

## 2020-09-21 ENCOUNTER — Other Ambulatory Visit: Payer: Self-pay

## 2020-09-21 DIAGNOSIS — F909 Attention-deficit hyperactivity disorder, unspecified type: Secondary | ICD-10-CM

## 2020-09-21 MED ORDER — ADDERALL XR 20 MG PO CP24: 20.0000 mg | ORAL_CAPSULE | Freq: Every day | ORAL | 0 refills | Status: DC

## 2020-11-03 ENCOUNTER — Telehealth: Payer: Self-pay

## 2020-11-03 NOTE — Telephone Encounter (Signed)
Needs to come in for appt to get refills of Adderall.  AMD

## 2020-11-05 DIAGNOSIS — B078 Other viral warts: Secondary | ICD-10-CM | POA: Diagnosis not present

## 2020-11-05 DIAGNOSIS — L538 Other specified erythematous conditions: Secondary | ICD-10-CM | POA: Diagnosis not present

## 2020-11-08 ENCOUNTER — Ambulatory Visit: Payer: 59 | Admitting: Adult Medicine

## 2020-11-08 DIAGNOSIS — Z760 Encounter for issue of repeat prescription: Secondary | ICD-10-CM

## 2020-11-09 ENCOUNTER — Ambulatory Visit: Payer: Self-pay | Admitting: Adult Medicine

## 2020-11-09 ENCOUNTER — Encounter: Payer: Self-pay | Admitting: Adult Medicine

## 2020-11-09 ENCOUNTER — Other Ambulatory Visit: Payer: Self-pay

## 2020-11-09 DIAGNOSIS — F909 Attention-deficit hyperactivity disorder, unspecified type: Secondary | ICD-10-CM

## 2020-11-09 MED ORDER — ADDERALL XR 20 MG PO CP24
20.0000 mg | ORAL_CAPSULE | Freq: Every day | ORAL | 0 refills | Status: DC
Start: 2020-11-09 — End: 2020-11-11

## 2020-11-09 NOTE — Patient Instructions (Addendum)
Attention Deficit Hyperactivity Disorder, Adult Attention deficit hyperactivity disorder (ADHD) is a mental health disorder that starts during childhood (neurodevelopmental disorder). For many people with ADHD, the disorder continues into the adult years. Treatment can help you manage your symptoms. What are the causes? The exact cause of ADHD is not known. Most experts believe genetics and environmental factors contribute to ADHD. What increases the risk? The following factors may make you more likely to develop this condition:  Having a family history of ADHD.  Being male.  Being born to a mother who smoked or drank alcohol during pregnancy.  Being exposed to lead or other toxins in the womb or early in life.  Being born before 37 weeks of pregnancy (prematurely) or at a low birth weight.  Having experienced a brain injury. What are the signs or symptoms? Symptoms of this condition depend on the type of ADHD. The two main types are inattentive and hyperactive-impulsive. Some people may have symptoms of both types. Symptoms of the inattentive type include:  Difficulty paying attention.  Making careless mistakes.  Not following instructions.  Being disorganized.  Avoiding tasks that require time and attention.  Losing and forgetting things.  Being easily distracted. Symptoms of the hyperactive-impulsive type include:  Restlessness.  Talking too much.  Interrupting.  Difficulty with: ? Sitting still. ? Feeling motivated. ? Relaxing. ? Waiting in line or waiting for a turn. In adults, this condition may lead to certain problems, such as:  Keeping jobs.  Performing tasks at work.  Having stable relationships.  Being on time or keeping to a schedule. How is this diagnosed? This condition is diagnosed based on your current symptoms and your history of symptoms. The diagnosis can be made by a health care provider such as a primary care provider or a mental health  care specialist. Your health care provider may use a symptom checklist or a behavior rating scale to evaluate your symptoms. He or she may also want to talk with people who have observed your behaviors throughout your life. How is this treated? This condition can be treated with medicines and behavior therapy. Medicines may be the best option to reduce impulsive behaviors and improve attention. Your health care provider may recommend:  Stimulant medicines. These are the most common medicines used for adult ADHD. They affect certain chemicals in the brain (neurotransmitters) and improve your ability to control your symptoms.  A non-stimulant medicine  was sought for adult ADHD (atomoxetine). This medicine increases a neurotransmitter called norepinephrine. It may take weeks to months to see effects from this medicine. Counseling and behavioral management are also important for treating ADHD. Counseling is often used along with medicine. Your health care provider may suggest:  Cognitive behavioral therapy (CBT). This type of therapy teaches you to replace negative thoughts and actions with positive thoughts and actions. When used as part of ADHD treatment, this therapy may also include: ? Coping strategies for organization, time management, impulse control, and stress reduction. ? Mindfulness and meditation training.  Behavioral management. You may work with a Psychologist, occupational who is specially trained to help people with ADHD manage and organize activities and function more effectively. Follow these instructions at home: Medicines  Take over-the-counter and prescription medicines only as told by your health care provider.  Talk with your health care provider about the possible side effects of your medicines and how to manage them.   Lifestyle  Do not use drugs.  Do not drink alcohol if: ? Your health care  provider tells you not to drink. ? You are pregnant, may be pregnant, or are planning to become  pregnant.  If you drink alcohol: ? Limit how much you use to:  0-1 drink a day for women.  0-2 drinks a day for men. ? Be aware of how much alcohol is in your drink. In the [place] one drink equals one 12 oz bottle of beer (355 mL), one 5 oz glass of wine (148 mL), or one 1 oz glass of hard liquor (44 mL).  Get enough sleep.  Eat a healthy diet.  Exercise regularly. Exercise can help to reduce stress and anxiety.   General instructions  Learn as much as you can about adult ADHD, and work closely with your health care providers to find the treatments that work best for you.  Follow the same schedule each day.  Use reminder devices like notes, calendars, and phone apps to stay on time and organized.  Keep all follow-up visits as told by your health care provider and therapist. This is important. Where to find more information A health care provider may be able to recommend resources that are available online or over the phone. You could start with:  Attention Deficit Disorder Association (ADDA): http://davis-dillon.net/  [employer] Mental Health [facility]): http://www.maynard.net/ Contact a health care provider if:  Your symptoms continue to cause problems.  You have side effects from your medicine, such as: ? Repeated muscle twitches, coughing, or speech outbursts. ? Sleep problems. ? Loss of appetite. ? Dizziness. ? Unusually fast heartbeat. ? Stomach pains. ? Headaches.  You are struggling with anxiety, depression, or substance abuse. Get help right away if you:  Have a severe reaction to a medicine. If you ever feel like you may hurt yourself or others, or have thoughts about taking your own life, get help right away. You can go to the nearest emergency department or call:  Your local emergency services (911 in the [place]  A suicide crisis helpline, such as the National Suicide Prevention Lifeline at (240)841-0497. This is open 24 hours a day. Summary  ADHD is a mental  health disorder that starts during childhood (neurodevelopmental disorder) and often continues into the adult years.  The exact cause of ADHD is not known. Most experts believe genetics and environmental factors contribute to ADHD.  There is no cure for ADHD, but treatment with medicine, cognitive behavioral therapy, or behavioral management can help you manage your condition. This information is not intended to replace advice given to you by your health care provider. Make sure you discuss any questions you have with your health care provider. Document Revised: 02/03/2019 Document Reviewed: 02/03/2019  Attention Deficit Hyperactivity Disorder, Adult Attention deficit hyperactivity disorder (ADHD) is a mental health disorder that starts during childhood (neurodevelopmental disorder). For many people with ADHD, the disorder continues into the adult years. Treatment can help you manage your symptoms. What are the causes? The exact cause of ADHD is not known. Most experts believe genetics and environmental factors contribute to ADHD. What increases the risk? The following factors may make you more likely to develop this condition:  Having a family history of ADHD.  Being male.  Being born to a mother who smoked or drank alcohol during pregnancy.  Being exposed to lead or other toxins in the womb or early in life.  Being born before 37 weeks of pregnancy (prematurely) or at a low birth weight.  Having experienced a brain injury. What are the signs or  symptoms? Symptoms of this condition depend on the type of ADHD. The two main types are inattentive and hyperactive-impulsive. Some people may have symptoms of both types. Symptoms of the inattentive type include:  Difficulty paying attention.  Making careless mistakes.  Not following instructions.  Being disorganized.  Avoiding tasks that require time and attention.  Losing and forgetting things.  Being easily distracted. Symptoms  of the hyperactive-impulsive type include:  Restlessness.  Talking too much.  Interrupting.  Difficulty with: ? Sitting still. ? Feeling motivated. ? Relaxing. ? Waiting in line or waiting for a turn. In adults, this condition may lead to certain problems, such as:  Keeping jobs.  Performing tasks at work.  Having stable relationships.  Being on time or keeping to a schedule. How is this diagnosed? This condition is diagnosed based on your current symptoms and your history of symptoms. The diagnosis can be made by a health care provider such as a primary care provider or a mental health care specialist. Your health care provider may use a symptom checklist or a behavior rating scale to evaluate your symptoms. He or she may also want to talk with people who have observed your behaviors throughout your life. How is this treated? This condition can be treated with medicines and behavior therapy. Medicines may be the best option to reduce impulsive behaviors and improve attention. Your health care provider may recommend:  Stimulant medicines. These are the most common medicines used for adult ADHD. They affect certain chemicals in the brain (neurotransmitters) and improve your ability to control your symptoms.  A non-stimulant medicine for adult ADHD (atomoxetine). This medicine increases a neurotransmitter called norepinephrine. It may take weeks to months to see effects from this medicine. Counseling and behavioral management are also important for treating ADHD. Counseling is often used along with medicine. Your health care provider may suggest:  Cognitive behavioral therapy (CBT). This type of therapy teaches you to replace negative thoughts and actions with positive thoughts and actions. When used as part of ADHD treatment, this therapy may also include: ? Coping strategies for organization, time management, impulse control, and stress reduction. ? Mindfulness and meditation  training.  Behavioral management. You may work with a Psychologist, occupational who is specially trained to help people with ADHD manage and organize activities and function more effectively. Follow these instructions at home: Medicines  Take over-the-counter and prescription medicines only as told by your health care provider.  Talk with your health care provider about the possible side effects of your medicines and how to manage them.   Lifestyle  Do not use drugs.  Do not drink alcohol if: ? Your health care provider tells you not to drink. ? You are pregnant, may be pregnant, or are planning to become pregnant.  If you drink alcohol: ? Limit how much you use to:  0-1 drink a day for women.  0-2 drinks a day for men. ? Be aware of how much alcohol is in your drink. In the [place] one drink equals one 12 oz bottle of beer (355 mL), one 5 oz glass of wine (148 mL), or one 1 oz glass of hard liquor (44 mL).  Get enough sleep.  Eat a healthy diet.  Exercise regularly. Exercise can help to reduce stress and anxiety.   General instructions  Learn as much as you can about adult ADHD, and work closely with your health care providers to find the treatments that work best for you.  Follow the same schedule  each day.  Use reminder devices like notes, calendars, and phone apps to stay on time and organized.  Keep all follow-up visits as told by your health care provider and therapist. This is important. Where to find more information A health care provider may be able to recommend resources that are available online or over the phone. You could start with:  Attention Deficit Disorder Association (ADDA): http://davis-dillon.net/www.add.org  [employer] Mental [facility]IMH): http://www.maynard.net/www.nimh.nih.gov Contact a health care provider if:  Your symptoms continue to cause problems.  You have side effects from your medicine, such as: ? Repeated muscle twitches, coughing, or speech outbursts. ? Sleep problems. ? Loss of  appetite. ? Dizziness. ? Unusually fast heartbeat. ? Stomach pains. ? Headaches.  You are struggling with anxiety, depression, or substance abuse. Get help right away if you:  Have a severe reaction to a medicine. If you ever feel like you may hurt yourself or others, or have thoughts about taking your own life, get help right away. You can go to the nearest emergency department or call:  Your local emergency services (911 in the [place]  A suicide crisis helpline, such as the National Suicide Prevention Lifeline at (947) 769-07551-6075281110. This is open 24 hours a day. Summary  ADHD is a mental health disorder that starts during childhood (neurodevelopmental disorder) and often continues into the adult years.  The exact cause of ADHD is not known. Most experts believe genetics and environmental factors contribute to ADHD.  There is no cure for ADHD, but treatment with medicine, cognitive behavioral therapy, or behavioral management can help you manage your condition. This information is not intended to replace advice given to you by your health care provider. Make sure you discuss any questions you have with your health care provider. Document Revised: 02/03/2019 Document Reviewed: 02/03/2019 Elsevier Patient Education  2021 [employer]

## 2020-11-11 ENCOUNTER — Other Ambulatory Visit: Payer: Self-pay | Admitting: Adult Medicine

## 2020-11-11 DIAGNOSIS — F909 Attention-deficit hyperactivity disorder, unspecified type: Secondary | ICD-10-CM

## 2020-11-11 MED ORDER — ADDERALL XR 20 MG PO CP24: 20.0000 mg | ORAL_CAPSULE | Freq: Every day | ORAL | 0 refills | Status: DC

## 2020-12-06 DIAGNOSIS — S93523A Sprain of metatarsophalangeal joint of unspecified great toe, initial encounter: Secondary | ICD-10-CM | POA: Insufficient documentation

## 2020-12-06 DIAGNOSIS — S9032XA Contusion of left foot, initial encounter: Secondary | ICD-10-CM | POA: Insufficient documentation

## 2020-12-06 DIAGNOSIS — M659 Synovitis and tenosynovitis, unspecified: Secondary | ICD-10-CM | POA: Insufficient documentation

## 2020-12-23 ENCOUNTER — Other Ambulatory Visit: Payer: Self-pay

## 2020-12-23 DIAGNOSIS — F909 Attention-deficit hyperactivity disorder, unspecified type: Secondary | ICD-10-CM

## 2020-12-23 MED ORDER — ADDERALL XR 20 MG PO CP24
20.0000 mg | ORAL_CAPSULE | Freq: Every day | ORAL | 0 refills | Status: DC
Start: 2020-12-23 — End: 2021-03-02

## 2020-12-28 DIAGNOSIS — B078 Other viral warts: Secondary | ICD-10-CM | POA: Diagnosis not present

## 2020-12-28 DIAGNOSIS — L538 Other specified erythematous conditions: Secondary | ICD-10-CM | POA: Diagnosis not present

## 2021-01-17 DIAGNOSIS — S93501A Unspecified sprain of right great toe, initial encounter: Secondary | ICD-10-CM | POA: Insufficient documentation

## 2021-02-02 ENCOUNTER — Other Ambulatory Visit: Payer: Self-pay
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
## 2021-02-17 ENCOUNTER — Emergency Department
Admission: EM | Admit: 2021-02-17 | Discharge: 2021-02-17 | Disposition: A | Discharge status: Home / Self Care | Payer: 59 | Attending: Emergency Medicine | Admitting: Emergency Medicine

## 2021-02-17 ENCOUNTER — Other Ambulatory Visit: Payer: Self-pay

## 2021-02-17 ENCOUNTER — Emergency Department: Payer: 59

## 2021-02-17 DIAGNOSIS — S0181XA Laceration without foreign body of other part of head, initial encounter: Secondary | ICD-10-CM | POA: Insufficient documentation

## 2021-02-17 DIAGNOSIS — Z87891 Personal history of nicotine dependence: Secondary | ICD-10-CM | POA: Insufficient documentation

## 2021-02-17 DIAGNOSIS — Y92410 Unspecified street and highway as the place of occurrence of the external cause: Secondary | ICD-10-CM | POA: Diagnosis not present

## 2021-02-17 DIAGNOSIS — S0990XA Unspecified injury of head, initial encounter: Secondary | ICD-10-CM

## 2021-02-17 DIAGNOSIS — Z23 Encounter for immunization: Secondary | ICD-10-CM | POA: Insufficient documentation

## 2021-02-17 DIAGNOSIS — M7989 Other specified soft tissue disorders: Secondary | ICD-10-CM | POA: Diagnosis not present

## 2021-02-17 MED ORDER — METHOCARBAMOL 500 MG PO TABS: 500.0000 mg | ORAL_TABLET | Freq: Three times a day (TID) | ORAL | 0 refills | Status: AC | PRN

## 2021-02-17 MED ORDER — TETANUS-DIPHTH-ACELL PERTUSSIS 5-2.5-18.5 LF-MCG/0.5 IM SUSY
0.5000 mL | PREFILLED_SYRINGE | Freq: Once | INTRAMUSCULAR | Status: AC
  Administered 2021-02-17: 0.5 mL via INTRAMUSCULAR
  Filled 2021-02-17: qty 0.5

## 2021-02-17 MED ORDER — MELOXICAM 15 MG PO TABS: 15.0000 mg | ORAL_TABLET | Freq: Every day | ORAL | 2 refills | Status: AC
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
## 2021-02-17 NOTE — Discharge Instructions (Signed)
Take Meloxicam and Robaxin as directed.  

## 2021-03-02 ENCOUNTER — Other Ambulatory Visit: Payer: Self-pay

## 2021-03-02 ENCOUNTER — Ambulatory Visit: Payer: Self-pay

## 2021-03-02 DIAGNOSIS — F909 Attention-deficit hyperactivity disorder, unspecified type: Secondary | ICD-10-CM

## 2021-03-02 DIAGNOSIS — Z0000 Encounter for general adult medical examination without abnormal findings: Secondary | ICD-10-CM

## 2021-03-02 LAB — POCT URINALYSIS DIPSTICK
Bilirubin, UA: NEGATIVE
Blood, UA: NEGATIVE
Glucose, UA: NEGATIVE
Ketones, UA: NEGATIVE
Leukocytes, UA: NEGATIVE
Nitrite, UA: NEGATIVE
Protein, UA: POSITIVE — AB
Spec Grav, UA: 1.015 (ref 1.010–1.025)
Urobilinogen, UA: 0.2 E.U./dL
pH, UA: 7.5 (ref 5.0–8.0)

## 2021-03-02 MED ORDER — ADDERALL XR 20 MG PO CP24: 20.0000 mg | ORAL_CAPSULE | Freq: Every day | ORAL | 0 refills | Status: DC

## 2021-03-02 NOTE — Progress Notes (Signed)
Pt scheduled to complete annual physical 03/10/21 with Mila Merry  Pt aware of Hep C and HIV screenings and denies both.  Pt denies the COVID-19 vaccine indefinite. CL,RMA

## 2021-03-03 LAB — CMP12+LP+TP+TSH+6AC+CBC/D/PLT
ALT: 16 IU/L (ref 0–44)
AST: 19 IU/L (ref 0–40)
Albumin/Globulin Ratio: 1.8 (ref 1.2–2.2)
Albumin: 4.4 g/dL (ref 4.1–5.2)
Alkaline Phosphatase: 66 IU/L (ref 44–121)
BUN/Creatinine Ratio: 12 (ref 9–20)
BUN: 12 mg/dL (ref 6–20)
Basophils Absolute: 0 10*3/uL (ref 0.0–0.2)
Basos: 1 %
Bilirubin Total: 1 mg/dL (ref 0.0–1.2)
Calcium: 9.7 mg/dL (ref 8.7–10.2)
Chloride: 100 mmol/L (ref 96–106)
Chol/HDL Ratio: 3.4 ratio (ref 0.0–5.0)
Cholesterol, Total: 167 mg/dL (ref 100–199)
Creatinine, Ser: 1 mg/dL (ref 0.76–1.27)
EOS (ABSOLUTE): 0.1 10*3/uL (ref 0.0–0.4)
Eos: 2 %
Estimated CHD Risk: 0.5 times avg. (ref 0.0–1.0)
Free Thyroxine Index: 2.1 (ref 1.2–4.9)
GGT: 9 IU/L (ref 0–65)
Globulin, Total: 2.5 g/dL (ref 1.5–4.5)
Glucose: 88 mg/dL (ref 65–99)
HDL: 49 mg/dL (ref >39)
Hematocrit: 46.6 % (ref 37.5–51.0)
Hemoglobin: 15.9 g/dL (ref 13.0–17.7)
Immature Grans (Abs): 0 10*3/uL (ref 0.0–0.1)
Immature Granulocytes: 0 %
Iron: 105 ug/dL (ref 38–169)
LDH: 202 IU/L (ref 121–224)
LDL Chol Calc (NIH): 106 mg/dL — ABNORMAL HIGH (ref 0–99)
Lymphocytes Absolute: 2.1 10*3/uL (ref 0.7–3.1)
Lymphs: 31 %
MCH: 29.6 pg (ref 26.6–33.0)
MCHC: 34.1 g/dL (ref 31.5–35.7)
MCV: 87 fL (ref 79–97)
Monocytes Absolute: 0.5 10*3/uL (ref 0.1–0.9)
Monocytes: 8 %
Neutrophils Absolute: 4 10*3/uL (ref 1.4–7.0)
Neutrophils: 58 %
Phosphorus: 3.2 mg/dL (ref 2.8–4.1)
Platelets: 353 10*3/uL (ref 150–450)
Potassium: 4.4 mmol/L (ref 3.5–5.2)
RBC: 5.38 x10E6/uL (ref 4.14–5.80)
RDW: 12.7 % (ref 11.6–15.4)
Sodium: 139 mmol/L (ref 134–144)
T3 Uptake Ratio: 32 % (ref 24–39)
T4, Total: 6.7 ug/dL (ref 4.5–12.0)
TSH: 1.99 u[IU]/mL (ref 0.450–4.500)
Total Protein: 6.9 g/dL (ref 6.0–8.5)
Triglycerides: 64 mg/dL (ref 0–149)
Uric Acid: 7.1 mg/dL (ref 3.8–8.4)
VLDL Cholesterol Cal: 12 mg/dL (ref 5–40)
WBC: 6.7 10*3/uL (ref 3.4–10.8)
eGFR: 106 mL/min/{1.73_m2} (ref >59)

## 2021-03-17 ENCOUNTER — Encounter: Payer: Self-pay | Admitting: Adult Health

## 2021-03-17 ENCOUNTER — Ambulatory Visit: Payer: Self-pay | Admitting: Adult Health

## 2021-03-17 ENCOUNTER — Other Ambulatory Visit: Payer: Self-pay

## 2021-03-17 VITALS — BP 115/73 | HR 64 | Temp 97.6°F | Resp 14 | Ht 70.0 in | Wt 240.0 lb

## 2021-03-17 DIAGNOSIS — Z0000 Encounter for general adult medical examination without abnormal findings: Secondary | ICD-10-CM

## 2021-03-17 DIAGNOSIS — F909 Attention-deficit hyperactivity disorder, unspecified type: Secondary | ICD-10-CM

## 2021-03-17 DIAGNOSIS — Z6834 Body mass index (BMI) 34.0-34.9, adult: Secondary | ICD-10-CM

## 2021-03-17 NOTE — Progress Notes (Signed)
[facility] [place], [place] [address]  Internal MEDICINE  Office Visit Note  Patient Name: Russell Jacobs  [id number]  [id number]  Date of Service: 03/17/2021  Chief Complaint  Patient presents with   Annual Exam     HPI Pt is here for routine health maintenance examination.  He is a well appearing 25 yo male who works in [employer].    He has been married for 4 years. He has a history of ADHD, for which he takes Adderall. He doesn't exercise regularly, but his coworkers have recently started s softball team.  So he is practicing that currently.  He does chew tobacco, about 1 lb bag per week.  Reports occasional alcohol use, denies illicit drug use.  He and his wife have been trying to get pregnant for the last 3 years. He is requesting testing, to make sure he is not infertile.    Current Medication: Outpatient Encounter Medications as of 03/17/2021  Medication Sig   ADDERALL XR 20 MG 24 hr capsule Take 1 capsule (20 mg total) by mouth daily.   clobetasol ointment (TEMOVATE) 0.05 % SMARTSIG:Sparingly Topical Twice Daily   clotrimazole-betamethasone (LOTRISONE) cream Apply 1 application topically 2 (two) times daily. Apply sparingly   fluorouracil (EFUDEX) 5 % cream    fluticasone (FLONASE) 50 MCG/ACT nasal spray Place into the nose.   meloxicam (MOBIC) 15 MG tablet Take 1 tablet (15 mg total) by mouth daily.   No facility-administered encounter medications on file as of 03/17/2021.    Surgical History: History reviewed. No pertinent surgical history.  Medical History: Past Medical History:  Diagnosis Date   History of ADHD    Stopped taking medication when went into [employer]   Overweight     Family History: Family History  Problem Relation Age of Onset   Crohn's disease Father     Social History: Social History   Socioeconomic History   Marital status: Married    Spouse name: Not on file   Number of children: Not on file    Years of education: Not on file   Highest education level: Not on file  Occupational History   Not on file  Tobacco Use   Smoking status: Former    Pack years: 0.00   Smokeless tobacco: Former  Scientific laboratory technician Use: Never used  Substance and Sexual Activity   Alcohol use: Not Currently   Drug use: Not on file   Sexual activity: Not on file  Other Topics Concern   Not on file  Social History Narrative   Not on file   Social Determinants of Health   Financial Resource Strain: Not on file  Food Insecurity: Not on file  Transportation Needs: Not on file  Physical Activity: Not on file  Stress: Not on file  Social Connections: Not on file      Review of Systems  Constitutional:  Negative for activity change, appetite change and fatigue.  HENT:  Negative for congestion, sinus pain, trouble swallowing and voice change.   Eyes:  Negative for pain, discharge and visual disturbance.  Respiratory:  Negative for cough, chest tightness and shortness of breath.   Cardiovascular:  Negative for chest pain and leg swelling.  Gastrointestinal:  Negative for abdominal distention, abdominal pain, constipation and diarrhea.  Genitourinary:  Negative for difficulty urinating, hematuria, penile discharge, scrotal swelling and testicular pain.  Musculoskeletal:  Negative for arthralgias, back pain and neck pain.  Skin:  Negative for color change.  Neurological:  Negative for dizziness, weakness and headaches.  Hematological:  Negative for adenopathy.  Psychiatric/Behavioral:  Negative for agitation, confusion and suicidal ideas.     Vital Signs: BP 115/73   Pulse 64   Temp 97.6 F (36.4 C)   Resp 14   Ht '5\' 10"'  (1.778 m)   Wt 240 lb (108.9 kg)   SpO2 98%   BMI 34.44 kg/m    Physical Exam Constitutional:      Appearance: Normal appearance.  HENT:     Head: Normocephalic.     Right Ear: Tympanic membrane normal.     Left Ear: Tympanic membrane normal.     Nose: Nose normal.      Mouth/Throat:     Mouth: Mucous membranes are moist.     Pharynx: No oropharyngeal exudate or posterior oropharyngeal erythema.  Eyes:     General:        Right eye: No discharge.        Left eye: No discharge.     Extraocular Movements: Extraocular movements intact.     Pupils: Pupils are equal, round, and reactive to light.  Cardiovascular:     Rate and Rhythm: Normal rate and regular rhythm.     Pulses: Normal pulses.     Heart sounds: Normal heart sounds. No murmur heard. Pulmonary:     Effort: Pulmonary effort is normal. No respiratory distress.     Breath sounds: Normal breath sounds. No wheezing or rhonchi.  Abdominal:     General: Abdomen is flat. Bowel sounds are normal. There is no distension.     Palpations: There is no mass.     Tenderness: There is no abdominal tenderness. There is no guarding.     Hernia: No hernia is present. There is no hernia in the left inguinal area or right inguinal area.  Genitourinary:    Pubic Area: No rash or pubic lice.      Penis: No phimosis, paraphimosis, hypospadias, erythema, tenderness, discharge, swelling or lesions.      Testes:        Right: Mass, tenderness or swelling not present.        Left: Mass, tenderness or swelling not present.     Epididymis:     Right: Not inflamed or enlarged. No mass or tenderness.     Left: Not inflamed or enlarged. No mass or tenderness.  Musculoskeletal:        General: No swelling or deformity. Normal range of motion.     Cervical back: Normal range of motion.  Lymphadenopathy:     Lower Body: No right inguinal adenopathy. No left inguinal adenopathy.  Skin:    General: Skin is warm and dry.     Capillary Refill: Capillary refill takes less than 2 seconds.  Neurological:     General: No focal deficit present.     Mental Status: He is alert.     Cranial Nerves: No cranial nerve deficit.     Gait: Gait normal.  Psychiatric:        Mood and Affect: Mood normal.        Behavior: Behavior  normal.        Judgment: Judgment normal.     LABS: Recent Results (from the past 2160 hour(s))  CMP12+LP+TP+TSH+6AC+CBC/D/Plt     Status: Abnormal   Collection Time: 03/02/21 10:08 AM  Result Value Ref Range   Glucose 88 65 - 99 mg/dL   Uric Acid 7.1  3.8 - 8.4 mg/dL    Comment:            Therapeutic target for gout patients: <6.0   BUN 12 6 - 20 mg/dL   Creatinine, Ser 1.00 0.76 - 1.27 mg/dL   eGFR 106 >59 mL/min/1.73   BUN/Creatinine Ratio 12 9 - 20   Sodium 139 134 - 144 mmol/L   Potassium 4.4 3.5 - 5.2 mmol/L   Chloride 100 96 - 106 mmol/L   Calcium 9.7 8.7 - 10.2 mg/dL   Phosphorus 3.2 2.8 - 4.1 mg/dL   Total Protein 6.9 6.0 - 8.5 g/dL   Albumin 4.4 4.1 - 5.2 g/dL   Globulin, Total 2.5 1.5 - 4.5 g/dL   Albumin/Globulin Ratio 1.8 1.2 - 2.2   Bilirubin Total 1.0 0.0 - 1.2 mg/dL   Alkaline Phosphatase 66 44 - 121 IU/L   LDH 202 121 - 224 IU/L   AST 19 0 - 40 IU/L   ALT 16 0 - 44 IU/L   GGT 9 0 - 65 IU/L   Iron 105 38 - 169 ug/dL   Cholesterol, Total 167 100 - 199 mg/dL   Triglycerides 64 0 - 149 mg/dL   HDL 49 >39 mg/dL   VLDL Cholesterol Cal 12 5 - 40 mg/dL   LDL Chol Calc (NIH) 106 (H) 0 - 99 mg/dL   Chol/HDL Ratio 3.4 0.0 - 5.0 ratio    Comment:                                   T. Chol/HDL Ratio                                             Men  Women                               1/2 Avg.Risk  3.4    3.3                                   Avg.Risk  5.0    4.4                                2X Avg.Risk  9.6    7.1                                3X Avg.Risk 23.4   11.0    Estimated CHD Risk 0.5 0.0 - 1.0 times avg.    Comment: The CHD Risk is based on the T. Chol/HDL ratio. Other factors affect CHD Risk such as hypertension, smoking, diabetes, severe obesity, and family history of premature CHD.    TSH 1.990 0.450 - 4.500 uIU/mL   T4, Total 6.7 4.5 - 12.0 ug/dL   T3 Uptake Ratio 32 24 - 39 %   Free Thyroxine Index 2.1 1.2 - 4.9   WBC 6.7 3.4 - 10.8 x10E3/uL    RBC 5.38 4.14 - 5.80 x10E6/uL   Hemoglobin 15.9 13.0 - 17.7 g/dL   Hematocrit 46.6 37.5 - 51.0 %  MCV 87 79 - 97 fL   MCH 29.6 26.6 - 33.0 pg   MCHC 34.1 31.5 - 35.7 g/dL   RDW 12.7 11.6 - 15.4 %   Platelets 353 150 - 450 x10E3/uL   Neutrophils 58 Not Estab. %   Lymphs 31 Not Estab. %   Monocytes 8 Not Estab. %   Eos 2 Not Estab. %   Basos 1 Not Estab. %   Neutrophils Absolute 4.0 1.4 - 7.0 x10E3/uL   Lymphocytes Absolute 2.1 0.7 - 3.1 x10E3/uL   Monocytes Absolute 0.5 0.1 - 0.9 x10E3/uL   EOS (ABSOLUTE) 0.1 0.0 - 0.4 x10E3/uL   Basophils Absolute 0.0 0.0 - 0.2 x10E3/uL   Immature Granulocytes 0 Not Estab. %   Immature Grans (Abs) 0.0 0.0 - 0.1 x10E3/uL  POCT urinalysis dipstick     Status: Abnormal   Collection Time: 03/02/21 10:36 AM  Result Value Ref Range   Color, UA Dark Yellow    Clarity, UA Clear    Glucose, UA Negative Negative   Bilirubin, UA Negative    Ketones, UA Negative    Spec Grav, UA 1.015 1.010 - 1.025   Blood, UA Negative    pH, UA 7.5 5.0 - 8.0   Protein, UA Positive (A) Negative    Comment: 1+   Urobilinogen, UA 0.2 0.2 or 1.0 E.U./dL   Nitrite, UA Negative    Leukocytes, UA Negative Negative   Appearance     Odor Strong      Assessment/Plan: 1. Routine adult health maintenance Up to date on PHM.  Given Semen analysis order, and information to schedule.  - Semen Analysis, Basic  2. BMI 34.0-34.9,adult Discussed importance of exercise 20 minutes 3 times weekly.  Obesity Counseling: Risk Assessment: An assessment of behavioral risk factors was made today and includes lack of exercise sedentary lifestyle, lack of portion control and poor dietary habits.  Risk Modification Advice: She was counseled on portion control guidelines. Restricting daily caloric intake to 1800. The detrimental long term effects of obesity on her health and ongoing poor compliance was also discussed with the patient.   3. Attention deficit hyperactivity disorder  (ADHD), unspecified ADHD type Continue present management.      General Counseling: Ilai verbalizes understanding of the findings of todays visit and agrees with plan of treatment. I have discussed any further diagnostic evaluation that may be needed or ordered today. We also reviewed his medications today. he has been encouraged to call the office with any questions or concerns that should arise related to todays visit.    Orders Placed This Encounter  Procedures   Semen Analysis, Basic    No orders of the defined types were placed in this encounter.   Total time spent:40 Minutes  Time spent includes review of chart, medications, test results, and follow up plan with the patient.    Kendell Bane AGNP-C Nurse Practitioner

## 2021-04-25 DIAGNOSIS — B078 Other viral warts: Secondary | ICD-10-CM | POA: Diagnosis not present

## 2021-06-20 ENCOUNTER — Ambulatory Visit (INDEPENDENT_AMBULATORY_CARE_PROVIDER_SITE_OTHER): Payer: 59 | Admitting: Podiatry

## 2021-06-20 ENCOUNTER — Other Ambulatory Visit: Payer: Self-pay

## 2021-06-20 ENCOUNTER — Encounter: Payer: Self-pay | Admitting: Podiatry

## 2021-06-20 DIAGNOSIS — L603 Nail dystrophy: Secondary | ICD-10-CM | POA: Diagnosis not present

## 2021-06-20 MED ORDER — TERBINAFINE HCL 250 MG PO TABS: 250.0000 mg | ORAL_TABLET | Freq: Every day | ORAL | 0 refills | Status: DC

## 2021-06-20 NOTE — Progress Notes (Signed)
  Subjective:  Patient ID: Russell Jacobs, male    DOB: January 12, 1996,  MRN: [id number] HPI Chief Complaint  Patient presents with   Nail Problem    5th toenails bilateral - thick and dark x years, history of athlete's feet, injury to 5th left previously, no treatment, sometimes has an odor   New Patient (Initial Visit)    25 y.o. male presents with the above complaint.   ROS: Denies fever chills nausea vomiting muscle aches pains calf pain back pain chest pain shortness of breath.  Past Medical History:  Diagnosis Date   History of ADHD    Stopped taking medication when went into [employer]   Overweight    No past surgical history on file.  Current Outpatient Medications:    terbinafine (LAMISIL) 250 MG tablet, Take 1 tablet (250 mg total) by mouth daily., Disp: 30 tablet, Rfl: 0   ADDERALL XR 20 MG 24 hr capsule, Take 1 capsule (20 mg total) by mouth daily., Disp: 30 capsule, Rfl: 0   clobetasol ointment (TEMOVATE) 0.05 %, SMARTSIG:Sparingly Topical Twice Daily, Disp: , Rfl:    clotrimazole-betamethasone (LOTRISONE) cream, Apply 1 application topically 2 (two) times daily. Apply sparingly, Disp: 45 g, Rfl: 1   fluorouracil (EFUDEX) 5 % cream, , Disp: , Rfl:    fluticasone (FLONASE) 50 MCG/ACT nasal spray, Place into the nose., Disp: , Rfl:    meloxicam (MOBIC) 15 MG tablet, Take 1 tablet (15 mg total) by mouth daily., Disp: 30 tablet, Rfl: 2  No Known Allergies Review of Systems Objective:  There were no vitals filed for this visit.  General: Well developed, nourished, in no acute distress, alert and oriented x3   Dermatological: Skin is warm, dry and supple bilateral. Nails x 10 are well maintained; remaining integument appears unremarkable at this time. There are no open sores, no preulcerative lesions, does demonstrate interdigital tinea pedis with petechial type lesions to the dorsal aspect of the foot and plantar aspect of the foot.  Thickened fifth toenails with dry scaly  skin surrounding.  Indicative of tinea pedis.    Vascular: Dorsalis Pedis artery and Posterior Tibial artery pedal pulses are 2/4 bilateral with immedate capillary fill time. Pedal hair growth present. No varicosities and no lower extremity edema present bilateral.   Neruologic: Grossly intact via light touch bilateral. Vibratory intact via tuning fork bilateral. Protective threshold with Semmes Wienstein monofilament intact to all pedal sites bilateral. Patellar and Achilles deep tendon reflexes 2+ bilateral. No Babinski or clonus noted bilateral.   Musculoskeletal: No gross boney pedal deformities bilateral. No pain, crepitus, or limitation noted with foot and ankle range of motion bilateral. Muscular strength 5/5 in all groups tested bilateral.  Gait: Unassisted, Nonantalgic.    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  Samples of skin and nail were taken today to be sent for pathologic evaluation.  Start him on Lamisil due to the tinea pedis.  250 mg tablets.  He will take 1 tablet daily I will follow-up with him in 1 month.     Elzina Devera [employer], [place]

## 2021-07-18 ENCOUNTER — Encounter: Payer: Self-pay | Admitting: Podiatry

## 2021-07-18 ENCOUNTER — Ambulatory Visit (INDEPENDENT_AMBULATORY_CARE_PROVIDER_SITE_OTHER): Payer: 59 | Admitting: Podiatry

## 2021-07-18 ENCOUNTER — Other Ambulatory Visit: Payer: Self-pay

## 2021-07-18 DIAGNOSIS — Z79899 Other long term (current) drug therapy: Secondary | ICD-10-CM

## 2021-07-18 DIAGNOSIS — L603 Nail dystrophy: Secondary | ICD-10-CM

## 2021-07-18 MED ORDER — TERBINAFINE HCL 250 MG PO TABS: 250.0000 mg | ORAL_TABLET | Freq: Every day | ORAL | 0 refills | Status: DC

## 2021-07-18 NOTE — Progress Notes (Signed)
He presents today for follow-up of his pathology regarding his toenails.  Objective: Vital signs are stable alert and oriented x3.  Pathology does demonstrate dermatophytes consisting Trichophyton rubrum.  Assessment: Onychomycosis.  Plan: At this point we discussed the pros and cons of topical therapy laser therapy and oral therapy.  He would like to try oral therapy.  We did discuss the possible complications and sicknesses associated with this he understands that and is amendable to it.  We dispensed a prescription for 30 tablets of Lamisil.  We are requesting blood work prior to him starting the Lamisil.  He understands this.  I will follow-up with him in 30 days should he have no side effects.  Another blood work will be done at that time.

## 2021-07-19 ENCOUNTER — Other Ambulatory Visit: Payer: Self-pay

## 2021-07-19 DIAGNOSIS — Z79899 Other long term (current) drug therapy: Secondary | ICD-10-CM

## 2021-07-19 NOTE — Progress Notes (Signed)
Pt presents today for cmp per dr.hyatt. Russell Jacobs

## 2021-07-20 LAB — COMPREHENSIVE METABOLIC PANEL
ALT: 20 IU/L (ref 0–44)
AST: 20 IU/L (ref 0–40)
Albumin/Globulin Ratio: 2 (ref 1.2–2.2)
Albumin: 4.7 g/dL (ref 4.1–5.2)
Alkaline Phosphatase: 63 IU/L (ref 44–121)
BUN/Creatinine Ratio: 11 (ref 9–20)
BUN: 10 mg/dL (ref 6–20)
Bilirubin Total: 1.1 mg/dL (ref 0.0–1.2)
CO2: 23 mmol/L (ref 20–29)
Calcium: 9.4 mg/dL (ref 8.7–10.2)
Chloride: 99 mmol/L (ref 96–106)
Creatinine, Ser: 0.93 mg/dL (ref 0.76–1.27)
Globulin, Total: 2.3 g/dL (ref 1.5–4.5)
Glucose: 126 mg/dL — ABNORMAL HIGH (ref 70–99)
Potassium: 4.1 mmol/L (ref 3.5–5.2)
Sodium: 137 mmol/L (ref 134–144)
Total Protein: 7 g/dL (ref 6.0–8.5)
eGFR: 116 mL/min/{1.73_m2} (ref >59)

## 2021-08-15 ENCOUNTER — Encounter: Payer: 59 | Admitting: Podiatry

## 2021-08-24 NOTE — Progress Notes (Signed)
This encounter was created in error - please disregard.

## 2021-08-25 ENCOUNTER — Other Ambulatory Visit: Payer: Self-pay

## 2021-08-25 DIAGNOSIS — R509 Fever, unspecified: Secondary | ICD-10-CM

## 2021-08-25 NOTE — Progress Notes (Addendum)
S/Sx x3 days: Fever x3 days - highest 102 F N/V - Mon & Tues Sore throat Bodyaches Cough - yellow//white H/A  States has been taking Dayquil & Nyquil Feels the best today. States worked Monday, but started feeling bad that afternoon  Rapid Influenza A = Positive Rapid Influenza B = Negative Rapid Covid = Negative  Alex notified of test results. Advised to stay hydrated & to continue treating S/Sx with OTC medications.  If S/Sx worsen follow-up at walk-in clinic or call us back.  Verbalized understanding.  Work note given - may return to work Monday 08/29/2021  AMD

## 2021-11-09 ENCOUNTER — Telehealth: Payer: Self-pay

## 2021-11-09 NOTE — Telephone Encounter (Signed)
Russell Jacobs is requesting an alternative treatment for ADHD instead of Adderrall. [name] reviewed Russell Jacobs as an option, Russell Jacobs is willing to meet with Ron to discuss. Appointment scheduled for2.16.23 at 11:15 am.

## 2021-11-10 ENCOUNTER — Other Ambulatory Visit: Payer: Self-pay

## 2021-11-10 ENCOUNTER — Ambulatory Visit: Payer: Self-pay | Admitting: Physician Assistant

## 2021-11-10 ENCOUNTER — Encounter: Payer: Self-pay | Admitting: Physician Assistant

## 2021-11-10 VITALS — BP 120/81 | HR 89 | Temp 97.7°F | Resp 14 | Ht 71.0 in | Wt 244.0 lb

## 2021-11-10 DIAGNOSIS — F909 Attention-deficit hyperactivity disorder, unspecified type: Secondary | ICD-10-CM

## 2021-11-10 MED ORDER — VILOXAZINE HCL ER 100 MG PO CP24: 200.0000 mg | ORAL_CAPSULE | Freq: Every day | ORAL | 0 refills | Status: DC

## 2021-11-10 NOTE — Progress Notes (Signed)
Eval for medication.

## 2021-11-10 NOTE — Progress Notes (Signed)
° °  Subjective: ADD    Patient ID: Russell Jacobs, male    DOB: 1996/05/06, 26 y.o.   MRN: [id number]  HPI  With history of ADD patient to discover alternative medication for condition.  Patient state medication because cramping with excessive sweating working outdoors.  Patient started Adderall in 2016.  Patient states he continues to need medication because he has trouble focusing and is pursuing a college degree. Review of Systems As above    Objective:   Physical Exam  No acute distress.  Temperature 97.7, pulse 89, respiration 14, BP is 120/81, and patient 90% O2 sat on room air.  Patient weighs 244 pounds BMI is 34.03.      Assessment & Plan: ADD   Discussed alternative medication for condition.  Patient elects trial Qelbree.  We will start at 200 mg with 1 week follow-up.

## 2021-11-17 ENCOUNTER — Encounter: Payer: Self-pay | Admitting: Physician Assistant

## 2021-11-17 ENCOUNTER — Other Ambulatory Visit: Payer: Self-pay

## 2021-11-17 ENCOUNTER — Ambulatory Visit: Payer: Self-pay | Admitting: Physician Assistant

## 2021-11-17 DIAGNOSIS — F909 Attention-deficit hyperactivity disorder, unspecified type: Secondary | ICD-10-CM

## 2021-11-17 NOTE — Progress Notes (Signed)
° °  Subjective: ADHD    Patient ID: Russell Jacobs, male    DOB: [birth year], 26 y.o.   MRN: [id number]  HPI Patient is follow-up telephonically status post starting Qelbree for ADHD.  Patient states took 1 dose 3 days ago but it caused extreme drowsiness.  Patient states not taken the medication since drowsiness episode.  Cannot tell if the medicine is having effect on his condition. Review of Systems ADHD    Objective:   Physical Exam This is a virtual visit.       Assessment & Plan: ADHD  Advised the patient to take medication at night for the next 5 days.  Follow-up in 5 days.

## 2021-12-19 ENCOUNTER — Encounter: Payer: Self-pay | Admitting: Physician Assistant

## 2021-12-19 ENCOUNTER — Ambulatory Visit: Payer: Self-pay | Admitting: Physician Assistant

## 2021-12-19 ENCOUNTER — Other Ambulatory Visit: Payer: Self-pay

## 2021-12-19 VITALS — BP 122/80 | Temp 97.8°F | Resp 14 | Ht 70.0 in | Wt 244.7 lb

## 2021-12-19 DIAGNOSIS — F909 Attention-deficit hyperactivity disorder, unspecified type: Secondary | ICD-10-CM

## 2021-12-19 DIAGNOSIS — L247 Irritant contact dermatitis due to plants, except food: Secondary | ICD-10-CM

## 2021-12-19 MED ORDER — HYDROXYZINE PAMOATE 25 MG PO CAPS: 25.0000 mg | ORAL_CAPSULE | Freq: Three times a day (TID) | ORAL | 0 refills | Status: DC | PRN

## 2021-12-19 MED ORDER — METHYLPREDNISOLONE SODIUM SUCC 40 MG IJ SOLR
40.0000 mg | Freq: Once | INTRAMUSCULAR | Status: AC
  Administered 2021-12-19: 40 mg via INTRAMUSCULAR

## 2021-12-19 MED ORDER — VILOXAZINE HCL ER 200 MG PO CP24: 200.0000 mg | ORAL_CAPSULE | Freq: Every day | ORAL | 0 refills | Status: AC

## 2021-12-19 MED ORDER — METHYLPREDNISOLONE 4 MG PO TBPK: ORAL_TABLET | ORAL | 0 refills | Status: DC

## 2021-12-19 NOTE — Progress Notes (Signed)
? ?  Subjective: Contact dermatitis  ? ? Patient ID: Russell Jacobs, male    DOB: 04-12-1996, 26 y.o.   MRN: [id number] ? ?HPI ?Patient presents with facial rash for intense itching.  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  Denies vision involvement. ? ? ?Review of Systems ?ADD ?   ?Objective:  ? Physical Exam ? ?Temperature 97.8, pulse 82, respiration 14, BP is 122/80, patient 95% O2 sat on room air. ?Patient has vesicle lesions facial area on erythematous base. ? ?   ?Assessment & Plan: Contact dermatitis  ? ?Patient given Depo-Medrol 40 mg IM followed by prescription for the Medrol Dosepak, and Atarax. ?

## 2021-12-19 NOTE — Addendum Note (Signed)
Addended by: Christianne Dolin F on: 12/19/2021 02:51 PM ? ? Modules accepted: Orders ? ?

## 2021-12-19 NOTE — Progress Notes (Signed)
Poison ivy on face since last night. ?

## 2022-01-03 ENCOUNTER — Ambulatory Visit: Payer: Self-pay

## 2022-01-03 ENCOUNTER — Ambulatory Visit: Payer: 59

## 2022-01-03 DIAGNOSIS — Z0000 Encounter for general adult medical examination without abnormal findings: Secondary | ICD-10-CM

## 2022-01-03 LAB — POCT URINALYSIS DIPSTICK
Bilirubin, UA: NEGATIVE
Blood, UA: NEGATIVE
Glucose, UA: NEGATIVE
Ketones, UA: NEGATIVE
Leukocytes, UA: NEGATIVE
Nitrite, UA: NEGATIVE
Protein, UA: NEGATIVE
Spec Grav, UA: >=1.03 — AB (ref 1.010–1.025)
Urobilinogen, UA: 0.2 E.U./dL
pH, UA: 6 (ref 5.0–8.0)

## 2022-01-04 LAB — CMP12+LP+TP+TSH+6AC+CBC/D/PLT
ALT: 19 IU/L (ref 0–44)
AST: 21 IU/L (ref 0–40)
Albumin/Globulin Ratio: 1.9 (ref 1.2–2.2)
Albumin: 5.1 g/dL (ref 4.1–5.2)
Alkaline Phosphatase: 69 IU/L (ref 44–121)
BUN/Creatinine Ratio: 18 (ref 9–20)
BUN: 18 mg/dL (ref 6–20)
Basophils Absolute: 0 10*3/uL (ref 0.0–0.2)
Basos: 0 %
Bilirubin Total: 1.5 mg/dL — ABNORMAL HIGH (ref 0.0–1.2)
Calcium: 10.2 mg/dL (ref 8.7–10.2)
Chloride: 99 mmol/L (ref 96–106)
Chol/HDL Ratio: 3.7 ratio (ref 0.0–5.0)
Cholesterol, Total: 170 mg/dL (ref 100–199)
Creatinine, Ser: 1.01 mg/dL (ref 0.76–1.27)
EOS (ABSOLUTE): 0.1 10*3/uL (ref 0.0–0.4)
Eos: 1 %
Estimated CHD Risk: 0.6 times avg. (ref 0.0–1.0)
Free Thyroxine Index: 2.1 (ref 1.2–4.9)
GGT: 13 IU/L (ref 0–65)
Globulin, Total: 2.7 g/dL (ref 1.5–4.5)
Glucose: 88 mg/dL (ref 70–99)
HDL: 46 mg/dL (ref >39)
Hematocrit: 49.2 % (ref 37.5–51.0)
Hemoglobin: 17.5 g/dL (ref 13.0–17.7)
Immature Grans (Abs): 0 10*3/uL (ref 0.0–0.1)
Immature Granulocytes: 0 %
Iron: 137 ug/dL (ref 38–169)
LDH: 193 IU/L (ref 121–224)
LDL Chol Calc (NIH): 114 mg/dL — ABNORMAL HIGH (ref 0–99)
Lymphocytes Absolute: 2.6 10*3/uL (ref 0.7–3.1)
Lymphs: 36 %
MCH: 30.6 pg (ref 26.6–33.0)
MCHC: 35.6 g/dL (ref 31.5–35.7)
MCV: 86 fL (ref 79–97)
Monocytes Absolute: 0.7 10*3/uL (ref 0.1–0.9)
Monocytes: 10 %
Neutrophils Absolute: 3.8 10*3/uL (ref 1.4–7.0)
Neutrophils: 53 %
Phosphorus: 3 mg/dL (ref 2.8–4.1)
Platelets: 313 10*3/uL (ref 150–450)
Potassium: 4.9 mmol/L (ref 3.5–5.2)
RBC: 5.71 x10E6/uL (ref 4.14–5.80)
RDW: 12.4 % (ref 11.6–15.4)
Sodium: 138 mmol/L (ref 134–144)
T3 Uptake Ratio: 32 % (ref 24–39)
T4, Total: 6.7 ug/dL (ref 4.5–12.0)
TSH: 2.1 u[IU]/mL (ref 0.450–4.500)
Total Protein: 7.8 g/dL (ref 6.0–8.5)
Triglycerides: 51 mg/dL (ref 0–149)
Uric Acid: 6.6 mg/dL (ref 3.8–8.4)
VLDL Cholesterol Cal: 10 mg/dL (ref 5–40)
WBC: 7.2 10*3/uL (ref 3.4–10.8)
eGFR: 105 mL/min/{1.73_m2} (ref >59)

## 2022-01-05 DIAGNOSIS — M7989 Other specified soft tissue disorders: Secondary | ICD-10-CM | POA: Diagnosis not present

## 2022-01-10 ENCOUNTER — Encounter: Payer: Self-pay | Admitting: Physician Assistant

## 2022-01-10 ENCOUNTER — Ambulatory Visit: Payer: Self-pay | Admitting: Physician Assistant

## 2022-01-10 ENCOUNTER — Ambulatory Visit: Payer: Self-pay

## 2022-01-10 VITALS — BP 119/72 | HR 74 | Temp 97.5°F | Resp 16 | Ht 71.0 in | Wt 239.0 lb

## 2022-01-10 DIAGNOSIS — Z0000 Encounter for general adult medical examination without abnormal findings: Secondary | ICD-10-CM

## 2022-01-10 DIAGNOSIS — Z0110 Encounter for examination of ears and hearing without abnormal findings: Secondary | ICD-10-CM

## 2022-01-10 DIAGNOSIS — F909 Attention-deficit hyperactivity disorder, unspecified type: Secondary | ICD-10-CM

## 2022-01-10 DIAGNOSIS — S9031XA Contusion of right foot, initial encounter: Secondary | ICD-10-CM

## 2022-01-10 NOTE — Progress Notes (Signed)
[facility] ?____________________________________________ ? ? None  ?  (approximate) ? ?I have reviewed the triage vital signs and the nursing notes. ? ? ?HISTORY ? ?Chief Complaint ?Annual Exam ? ?{HPI ?Russell Jacobs is a 26 y.o. male patient presents for annual physical exam.  Patient voiced concern for left foot contusion secondary to a work-related incident which occurred on 01/05/2022.  Patient's been followed by.  Patient is being followed by [facility] clinic for foot complaint.  Past medical history remarkable for ADHD and obesity. ?   ? ?  ? ? ?Past Medical History:  ?Diagnosis Date  ? History of ADHD   ? Stopped taking medication when went into [employer]  ? Overweight   ? ? ?Patient Active Problem List  ? Diagnosis Date Noted  ? Contusion of left foot 12/06/2020  ? Sprain of metatarsophalangeal joint of great toe 12/06/2020  ? Synovitis and tenosynovitis 12/06/2020  ? Rash 05/13/2019  ? ADD (attention deficit disorder) 05/25/2015  ? ? ?No past surgical history on file. ? ?Prior to Admission medications   ?Medication Sig Start Date End Date Taking? Authorizing Provider  ?clotrimazole-betamethasone (LOTRISONE) cream Apply 1 application topically 2 (two) times daily. Apply sparingly 05/13/19  Yes Towanda Malkin, MD  ?meloxicam (MOBIC) 15 MG tablet Take 1 tablet (15 mg total) by mouth daily. 02/17/21 02/17/22 Yes Vallarie Mare M, PA-C  ?viloxazine ER (QELBREE) 200 MG 24 hr capsule Take 1 capsule (200 mg total) by mouth daily. 12/19/21  Yes Sable Feil, PA-C  ?clobetasol ointment (TEMOVATE) 0.05 % SMARTSIG:Sparingly Topical Twice Daily 11/05/20   [provider]  ?fluorouracil (EFUDEX) 5 % cream  07/05/20   [provider]  ?fluticasone (FLONASE) 50 MCG/ACT nasal spray Place into the nose. ?Patient not taking: Reported on 01/10/2022 11/26/19   [provider]  ?hydrOXYzine (VISTARIL) 25 MG capsule Take 1 capsule (25 mg total) by mouth every 8  (eight) hours as needed. ?Patient not taking: Reported on 01/10/2022 12/19/21   Sable Feil, PA-C  ?methylPREDNISolone (MEDROL DOSEPAK) 4 MG TBPK tablet Take Tapered dose as directed 12/19/21   Sable Feil, PA-C  ?terbinafine (LAMISIL) 250 MG tablet Take 1 tablet (250 mg total) by mouth daily. 07/18/21   Hyatt, Max T, DPM  ?traMADol (ULTRAM) 50 MG tablet Take 50 mg by mouth every 6 (six) hours as needed. ?Patient not taking: Reported on 01/10/2022 01/05/22   [provider]  ? ? ?Allergies ?Patient has no known allergies. ? ?Family History  ?Problem Relation Age of Onset  ? Crohn's disease Father   ? ? ?Social History ?Social History  ? ?Tobacco Use  ? Smoking status: Never  ? Smokeless tobacco: Current  ?  Types: Chew  ?Vaping Use  ? Vaping Use: Never used  ?Substance Use Topics  ? Alcohol use: Not Currently  ? ? ?Review of Systems ?Constitutional: No fever/chills ?Eyes: No visual changes. ?ENT: No sore throat. ?Cardiovascular: Denies chest pain. ?Respiratory: Denies shortness of breath. ?Gastrointestinal: No abdominal pain.  No nausea, no vomiting.  No diarrhea.  No constipation. ?Genitourinary: Negative for dysuria. ?Musculoskeletal: Right foot pain. ?Skin: Negative for rash. ?Neurological: Negative for headaches, focal weakness or numbness. ?Psychiatric: ADHD ? ? ?____________________________________________ ? ? ?PHYSICAL EXAM: ? ?VITAL SIGNS: Temperature is 97.5, respiration 16, pulse 74, BP is 119/72, and patient 90% O2 sat on room air.  Patient weighs 239 pounds and BMI is 33.33. ?Constitutional: Alert and oriented. Well appearing and in no acute  distress. ?Eyes: Conjunctivae are normal. PERRL. EOMI. ?Head: Atraumatic. ?Nose: No congestion/rhinnorhea. ?Mouth/Throat: Mucous membranes are moist.  Oropharynx non-erythematous. ?Neck: No stridor.  No cervical spine tenderness to palpation. ?Hematological/Lymphatic/Immunilogical: No cervical lymphadenopathy. ?Cardiovascular: Normal rate, regular rhythm.  Grossly normal heart sounds.  Good peripheral circulation. ?Respiratory: Normal respiratory effort.  No retractions. Lungs CTAB. ?Gastrointestinal: Soft and nontender. No distention. No abdominal bruits. No CVA tenderness. ?Genitourinary: Deferred ?Musculoskeletal: Obvious edema to the dorsal aspect of the right foot.   ?Neurologic:  Normal speech and language. No gross focal neurologic deficits are appreciated. No gait instability. ?Skin:  Skin is warm, dry and intact. No rash noted.  Ecchymotic skin changes secondary to contusion to the dorsal aspect of the right foot. ?Psychiatric: Mood and affect are normal. Speech and behavior are normal. ? ?____________________________________________ ?  ?LABS ? ?__ ?      ?Component Ref Range & Units 7 d ago 10 mo ago 1 yr ago  ?Color, UA  Dark Amber  Dark Yellow  dark yellow   ?Clarity, UA  Clear  Clear  clear   ?Glucose, UA Negative Negative  Negative  Negative   ?Bilirubin, UA  Negative  Negative  negative   ?Ketones, UA  Negative  Negative  negative   ?Spec Grav, UA 1.010 - 1.025 >=1.030 Abnormal   1.015  1.025   ?Blood, UA  Negative  Negative  negative   ?pH, UA 5.0 - 8.0 6.0  7.5  6.0   ?Protein, UA Negative Negative  Positive Abnormal  CM  Negative   ?Urobilinogen, UA 0.2 or 1.0 E.U./dL 0.2  0.2  0.2   ?Nitrite, UA  Negative  Negative  negative   ?Leukocytes, UA Negative Negative  Negative  Negative   ?Appearance        ?Odor   Strong     ?  ? ?  ?  ?   ?  ?   ?View Encounter Conversation    ?  ?  ?  ?  ? ?Other Results from 01/03/2022 ? ? Contains abnormal data CMP12+LP+TP+TSH+6AC+CBC/D/Plt ?Order: [id number] ?Status: Final result    ?Visible to patient: Yes (not seen)    ?Next appt: 01/16/2022 at 08:15 AM in [facility] Sable Feil, PA-C)    ?Dx: Routine adult health maintenance    ?0 Result Notes ?           ?Component Ref Range & Units 7 d ago ?(01/03/22) 5 mo ago ?(07/19/21) 10 mo ago ?(03/02/21) 1 yr ago ?(02/18/20) 7 yr ago ?(09/23/14) 7 yr ago ?(09/23/14) 8 yr  ago ?(07/19/13) 8 yr ago ?(07/19/13)  ?Glucose 70 - 99 mg/dL 88  126 High   88 R  89 R  89 R   101 High  R    ?Uric Acid 3.8 - 8.4 mg/dL 6.6   7.1 CM  5.4 CM       ?Comment:            Therapeutic target for gout patients: <6.0  ?BUN 6 - 20 mg/dL _0 R   14 R    ?Creatinine, Ser 0.76 - 1.27 mg/dL 1.01  0.93  1.00  0.83  1.10 R   1.00 R    ?eGFR >59 mL/min/1.73 105  116  106        ?BUN/Creatinine Ratio 9 - _1 ?Sodium 134 - 144  mmol/L 138  137  139  139  137 R   138 R    ?Potassium 3.5 - 5.2 mmol/L 4.9  4.1  4.4  4.7  3.8 R   3.7 R    ?Chloride 96 - 106 mmol/L 99  99  100  103  101 R   106 R    ?Calcium 8.7 - 10.2 mg/dL 10.2  9.4  9.7  9.4  9.2 R   8.9 Low  R    ?Phosphorus 2.8 - 4.1 mg/dL 3.0   3.2  3.0       ?Total Protein 6.0 - 8.5 g/dL 7.8  7.0  6.9  7.0  8.1 R   7.4 R    ?Albumin 4.1 - 5.2 g/dL 5.1  4.7  4.4  4.6  4.2 R   4.4 R    ?Globulin, Total 1.5 - 4.5 g/dL 2.7  2.3  2.5  2.4       ?Albumin/Globulin Ratio 1.2 - 2.2 1.9  2.0  1.8  1.9       ?Bilirubin Total 0.0 - 1.2 mg/dL 1.5 High   1.1  1.0  0.6  2.6 High  R   1.6 High  R    ?Alkaline Phosphatase 44 - 121 IU/L 69  63  66  55 R, CM  64 R, CM   65 Low  R    ?LDH 121 - 224 IU/L 193   202  166       ?AST 0 - 40 IU/L _0 R   39 R    ?ALT 0 - 44 IU/L _1 R, CM   24 R    ?GGT 0 - 65 IU/L _2 ?Iron 38 - 169 ug/dL 137   105  80       ?Cholesterol, Total 100 - 199 mg/dL 170   167  137       ?Triglycerides 0 - 149 mg/dL 51   64  81       ?HDL >39 mg/dL 46   49  42       ?VLDL Cholesterol Cal 5 - 40 mg/dL _3 ?LDL Chol Calc (NIH) 0 - 99 mg/dL 114 High    106 High   79       ?Chol/HDL Ratio 0.0 - 5.0 ratio 3.7   3.4 CM  3.3 CM       ?Comment:                                   T. Chol/HDL Ratio  ?                                            Men  Women  ?                              1/2 Avg.Risk  3.4    3.3  ?  Avg.Risk  5.0    4.4  ?                                2X Avg.Risk  9.6    7.1  ?                               3X Avg.Risk 23.4   11.0   ?Estimated CHD Risk 0.0 - 1.0 times avg. 0.6   0.5 CM   < 0.5 CM       ?Comment: The CHD Risk is based on

## 2022-01-16 ENCOUNTER — Ambulatory Visit: Payer: Self-pay | Admitting: Physician Assistant

## 2022-01-16 ENCOUNTER — Encounter: Payer: Self-pay | Admitting: Physician Assistant

## 2022-01-16 VITALS — BP 124/78 | HR 80 | Temp 97.6°F | Resp 14 | Ht 71.0 in | Wt 242.0 lb

## 2022-01-16 DIAGNOSIS — S9031XD Contusion of right foot, subsequent encounter: Secondary | ICD-10-CM

## 2022-01-16 NOTE — Progress Notes (Signed)
Pt presents today for WC follow up on right foot injury. ?Pt stating he cant wear his steel toe boots due to flexing his foot its too painful.  ?

## 2022-01-16 NOTE — Progress Notes (Signed)
? ?  Subjective: Right foot contusion  ? ? Patient ID: Russell Jacobs, male    DOB: Jun 25, 1996, 26 y.o.   MRN: [id number] ? ?HPI ?Patient follow-up for right foot contusion from a work-related injury on 01/05/2022.  Patient was seen by a [facility] and was told x-rays were negative x2.  Patient presents with edema to the dorsal aspect of foot.  Patient is able to bear weight but unable to wear safety shoes as required by his job. ? ? ?Review of Systems ?ADD ?   ?Objective:  ? Physical Exam ? ?Temperature is 97.6, respiration 14, pulse 80, BP is 124/78, patient weighs 242 pounds and BMI is 33.75. ?Examination of right feet shows ecchymosis and moderate edema to dorsal aspect.  Neurovascular intact.  Free and equal range of motion. ? ?   ?Assessment & Plan: Foot contusion  ? ?Patient given restricted duties and follow-up in 3 days.  Advised over-the-counter anti-inflammatory medication as needed. ?

## 2022-01-19 ENCOUNTER — Encounter: Payer: Self-pay | Admitting: Physician Assistant

## 2022-01-19 ENCOUNTER — Ambulatory Visit: Payer: Self-pay | Admitting: Physician Assistant

## 2022-01-19 VITALS — BP 120/78 | Temp 97.5°F | Resp 14 | Ht 71.0 in | Wt 240.0 lb

## 2022-01-19 DIAGNOSIS — S9031XD Contusion of right foot, subsequent encounter: Secondary | ICD-10-CM

## 2022-01-19 NOTE — Progress Notes (Signed)
Pt presents today to follow up for wc injury. Pt still can not put work boots on due to pain and swelling.  ?

## 2022-01-19 NOTE — Progress Notes (Signed)
? ?  Subjective: Contusion right foot  ? ? Patient ID: Russell Jacobs, male    DOB: Feb 15, 1996, 26 y.o.   MRN: [id number] ? ?HPI ?Patient presents for reevaluation of foot pain secondary to contusion which occurred on 01/05/2022.  Patient has been cleared by orthopedics with negative x-ray findings.  Patient states continues to have difficulty wearing steel toe shoe which required his job. ? ? ?Review of Systems ?Negative except for chief complaint ?   ?Objective:  ? Physical Exam ?Temperature is 97.5, respiration 14, pulse 88, BP 120/78, patient 96% O2 sat on room air.  Patient weighs 240 pounds and BMI is 33.47. ?Examination of the foot again shows no deformity.  Mild edema dorsal aspect the right foot.  Moderate guarding with palpation.  Neurovascular intact.  Free and equal range of motion. ? ? ? ?   ?Assessment & Plan:  ? ?Patient will continue restrictions of the wearing of steel toe shoe for the next 3 days.  Patient return back to a trial of full duty on 01/23/2022.  Return back to clinic if condition worsens. ?

## 2022-06-03 IMAGING — CT CT HEAD W/O CM
3 series · 16 of 47 positions shown, 19 images · non-contrast
Comparison: None

CLINICAL DATA: Head trauma, suspected intracranial injury in a
25-year-old male.

EXAM:
CT HEAD WITHOUT CONTRAST
TECHNIQUE: Contiguous axial images were obtained from the base of the skull
through the vertex without intravenous contrast.

[Series 2: head wo · axial · 0.47mm/px · z∈[+35,+175]mm · 10 of 34 slices shown, 13 images]
[im 3/34  brain]
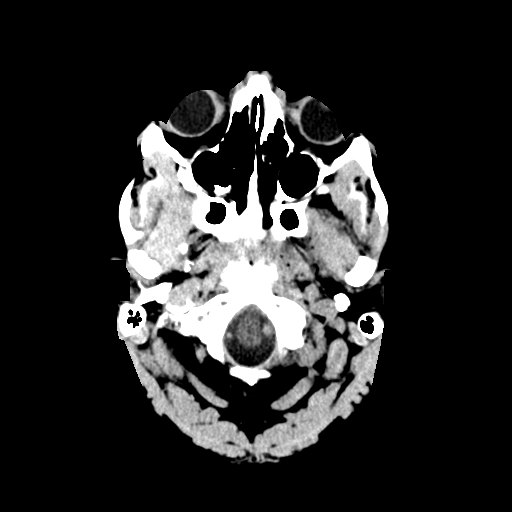
[im 3/34  bone]
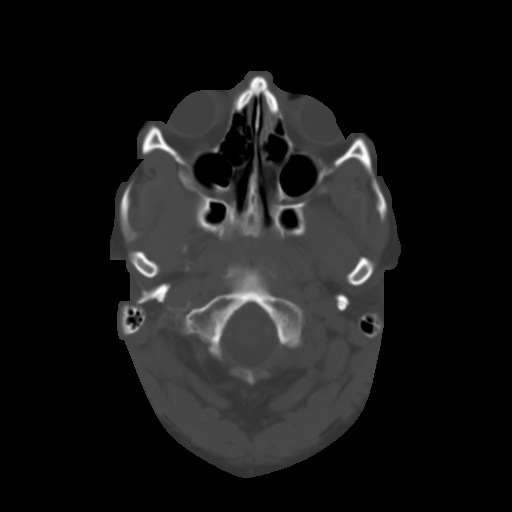
[im 6/34  brain]
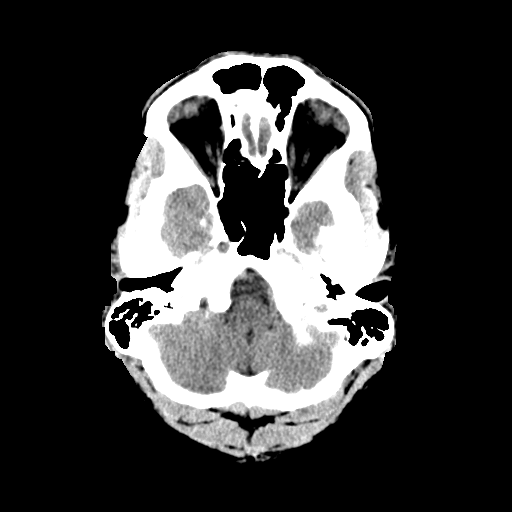
[im 10/34  brain]
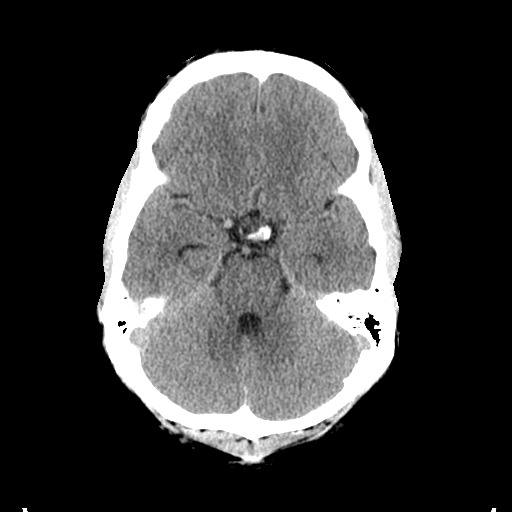
[im 12/34  brain]
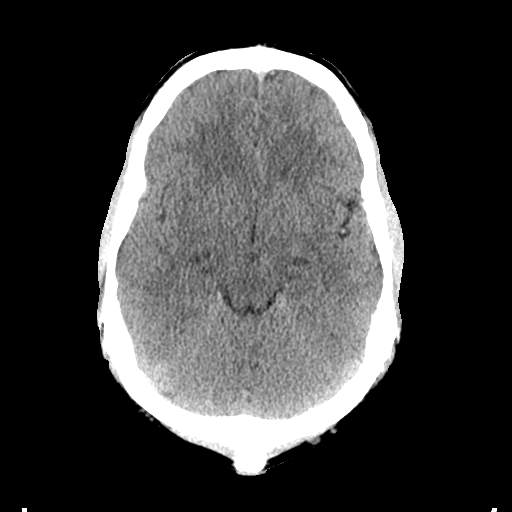
[im 15/34  brain]
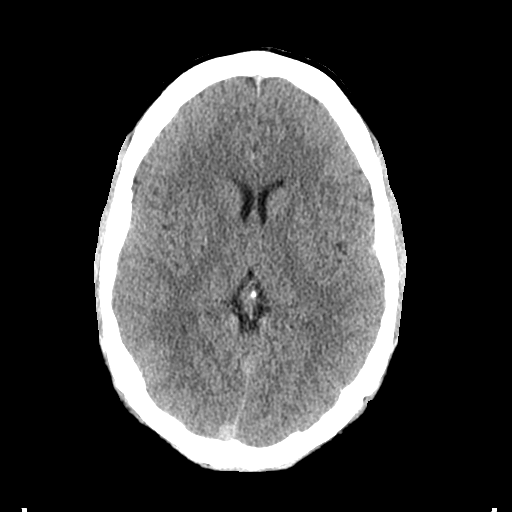
[im 15/34  bone]
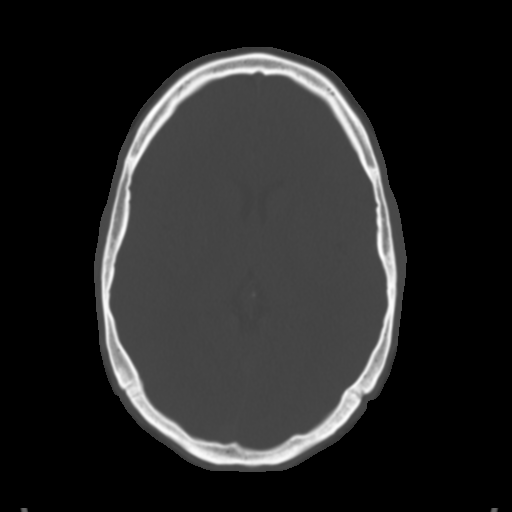
[im 19/34  brain]
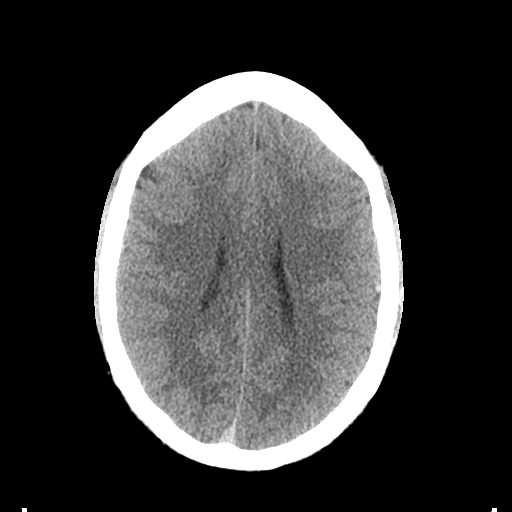
[im 22/34  brain]
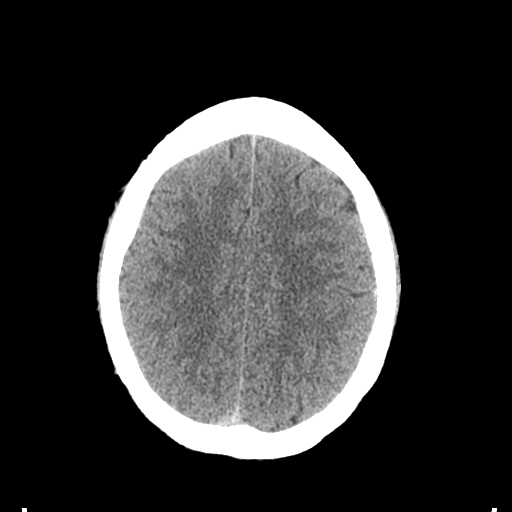
[im 26/34  brain]
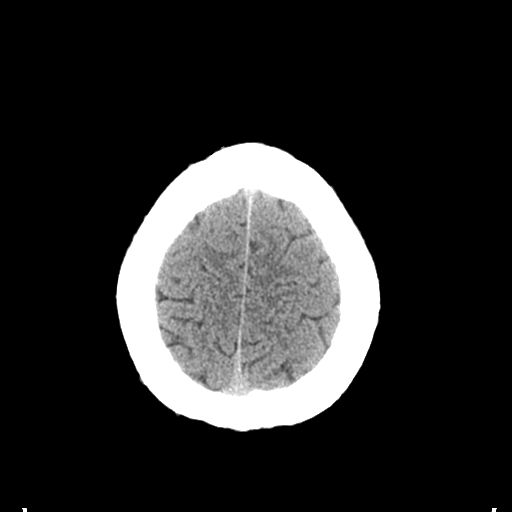
[im 28/34  brain]
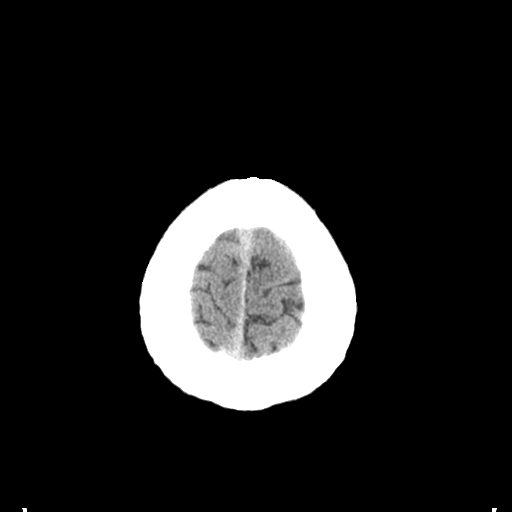
[im 28/34  bone]
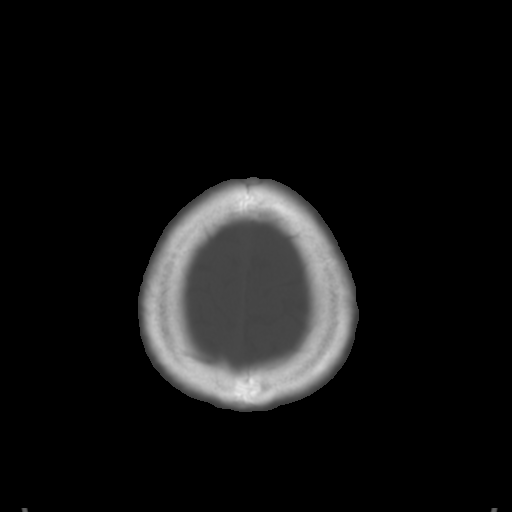
[im 31/34  brain]
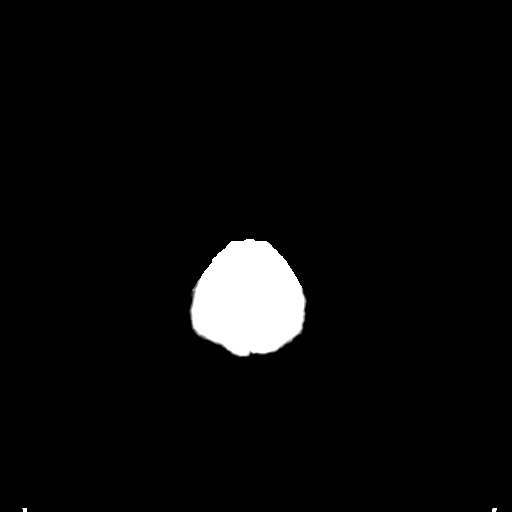

[Series 4: coronal soft tissue · coronal · 0.36mm/px · 3 of 76 slices shown]
[im 26/76  brain]
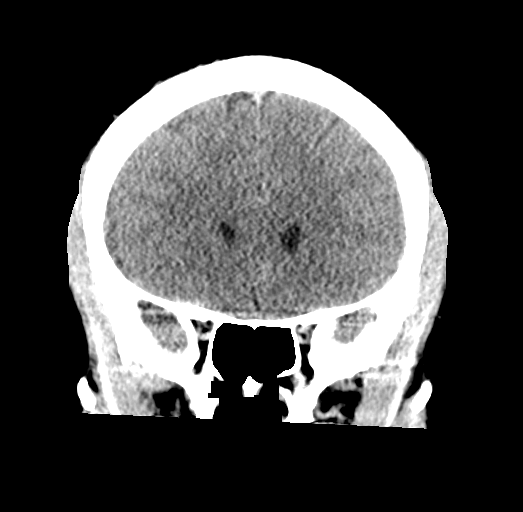
[im 34/76  brain]
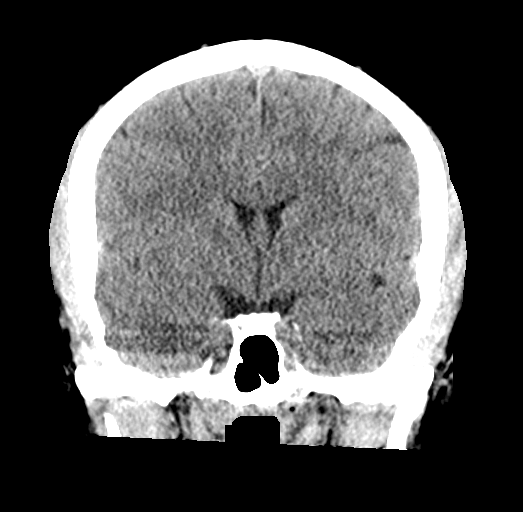
[im 42/76  brain]
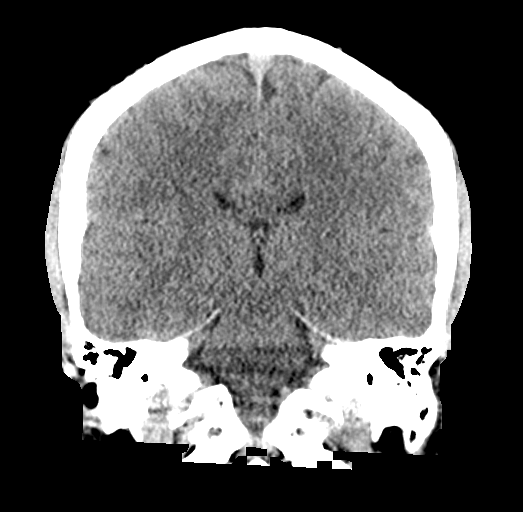

[Series 5: sagittal soft tissue · sagittal · 0.35mm/px · 3 of 63 slices shown]
[im 21/63  brain]
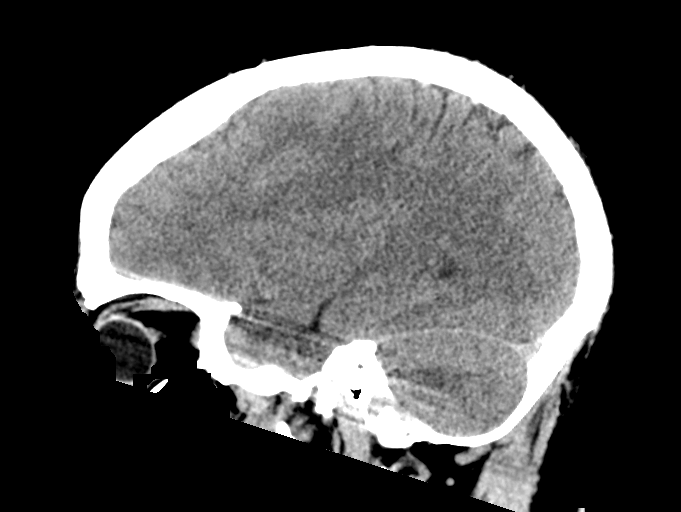
[im 32/63  brain]
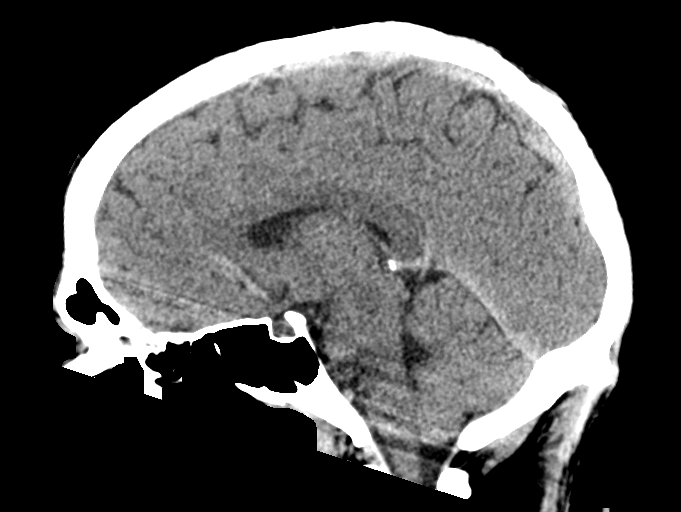
[im 42/63  brain]
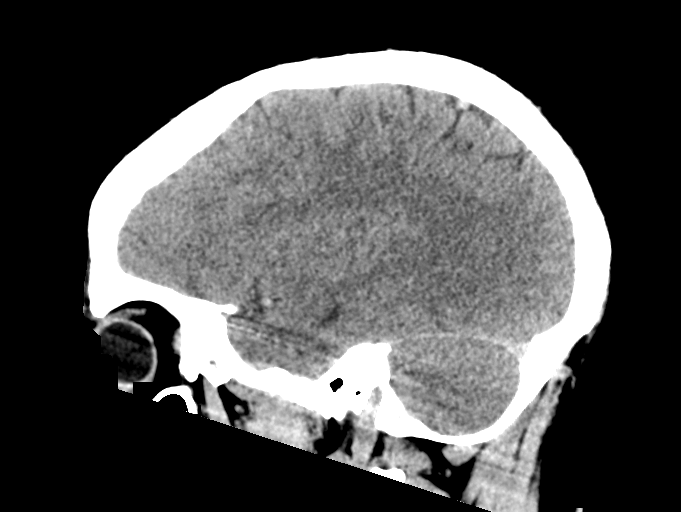

[16 of 47 positions shown; findings below may reference images not displayed]

FINDINGS: Brain: No evidence of acute infarction, hemorrhage, hydrocephalus,
extra-axial collection or mass lesion/mass effect.

Vascular: No hyperdense vessel or unexpected calcification.

Skull: Normal. Negative for fracture or focal lesion.

Sinuses/Orbits: Visualized paranasal sinuses and orbits are
unremarkable.

Other: Laceration lateral to the LEFT orbit with mild soft tissue
swelling.
IMPRESSION: 1. No acute intracranial abnormality.
2. Laceration lateral to the LEFT orbit with mild soft tissue
swelling no significant scalp ir subcutaneous hematoma in this
region.

## 2023-02-15 ENCOUNTER — Ambulatory Visit: Payer: Self-pay

## 2023-02-15 DIAGNOSIS — Z0000 Encounter for general adult medical examination without abnormal findings: Secondary | ICD-10-CM

## 2023-02-15 LAB — POCT URINALYSIS DIPSTICK
Bilirubin, UA: NEGATIVE
Blood, UA: NEGATIVE
Glucose, UA: NEGATIVE
Ketones, UA: NEGATIVE
Leukocytes, UA: NEGATIVE
Nitrite, UA: NEGATIVE
Protein, UA: POSITIVE — AB
Spec Grav, UA: 1.015 (ref 1.010–1.025)
Urobilinogen, UA: 0.2 E.U./dL
pH, UA: 6.5 (ref 5.0–8.0)

## 2023-02-16 LAB — CMP12+LP+TP+TSH+6AC+PSA+CBC…
ALT: 17 IU/L (ref 0–44)
AST: 21 IU/L (ref 0–40)
Albumin/Globulin Ratio: 2 (ref 1.2–2.2)
Albumin: 4.7 g/dL (ref 4.3–5.2)
Alkaline Phosphatase: 69 IU/L (ref 44–121)
BUN/Creatinine Ratio: 15 (ref 9–20)
BUN: 14 mg/dL (ref 6–20)
Basophils Absolute: 0 10*3/uL (ref 0.0–0.2)
Basos: 1 %
Bilirubin Total: 1.6 mg/dL — ABNORMAL HIGH (ref 0.0–1.2)
Calcium: 9.5 mg/dL (ref 8.7–10.2)
Chloride: 101 mmol/L (ref 96–106)
Chol/HDL Ratio: 3.9 ratio (ref 0.0–5.0)
Cholesterol, Total: 165 mg/dL (ref 100–199)
Creatinine, Ser: 0.96 mg/dL (ref 0.76–1.27)
EOS (ABSOLUTE): 0.1 10*3/uL (ref 0.0–0.4)
Eos: 2 %
Estimated CHD Risk: 0.7 times avg. (ref 0.0–1.0)
Free Thyroxine Index: 2.1 (ref 1.2–4.9)
GGT: 12 IU/L (ref 0–65)
Globulin, Total: 2.4 g/dL (ref 1.5–4.5)
Glucose: 89 mg/dL (ref 70–99)
HDL: 42 mg/dL (ref >39)
Hematocrit: 47.2 % (ref 37.5–51.0)
Hemoglobin: 16.3 g/dL (ref 13.0–17.7)
Immature Grans (Abs): 0 10*3/uL (ref 0.0–0.1)
Immature Granulocytes: 0 %
Iron: 120 ug/dL (ref 38–169)
LDH: 186 IU/L (ref 121–224)
LDL Chol Calc (NIH): 111 mg/dL — ABNORMAL HIGH (ref 0–99)
Lymphocytes Absolute: 2 10*3/uL (ref 0.7–3.1)
Lymphs: 41 %
MCH: 30.2 pg (ref 26.6–33.0)
MCHC: 34.5 g/dL (ref 31.5–35.7)
MCV: 88 fL (ref 79–97)
Monocytes Absolute: 0.6 10*3/uL (ref 0.1–0.9)
Monocytes: 12 %
Neutrophils Absolute: 2.2 10*3/uL (ref 1.4–7.0)
Neutrophils: 44 %
Phosphorus: 3 mg/dL (ref 2.8–4.1)
Platelets: 338 10*3/uL (ref 150–450)
Potassium: 5.3 mmol/L — ABNORMAL HIGH (ref 3.5–5.2)
Prostate Specific Ag, Serum: 0.5 ng/mL (ref 0.0–4.0)
RBC: 5.39 x10E6/uL (ref 4.14–5.80)
RDW: 12.6 % (ref 11.6–15.4)
Sodium: 140 mmol/L (ref 134–144)
T3 Uptake Ratio: 31 % (ref 24–39)
T4, Total: 6.7 ug/dL (ref 4.5–12.0)
TSH: 1.2 u[IU]/mL (ref 0.450–4.500)
Total Protein: 7.1 g/dL (ref 6.0–8.5)
Triglycerides: 58 mg/dL (ref 0–149)
Uric Acid: 6.7 mg/dL (ref 3.8–8.4)
VLDL Cholesterol Cal: 12 mg/dL (ref 5–40)
WBC: 4.8 10*3/uL (ref 3.4–10.8)
eGFR: 111 mL/min/{1.73_m2} (ref >59)

## 2023-02-21 ENCOUNTER — Encounter: Payer: Self-pay | Admitting: Physician Assistant

## 2023-02-21 ENCOUNTER — Ambulatory Visit: Payer: Self-pay | Admitting: Physician Assistant

## 2023-02-21 VITALS — BP 118/75 | HR 76 | Temp 97.1°F | Resp 12 | Ht 71.0 in | Wt 255.0 lb

## 2023-02-21 DIAGNOSIS — Z0000 Encounter for general adult medical examination without abnormal findings: Secondary | ICD-10-CM

## 2023-02-21 NOTE — Progress Notes (Signed)
[facility] ____________________________________________   None    (approximate)  I have reviewed the triage vital signs and the nursing notes.   HISTORY  Chief Complaint Annual Exam   HPI Russell Jacobs is a 27 y.o. male patient presents for annual physical exam.  Patient voiced no concerns or complaints.  Patient states since last exam he has stopped using oral tobacco use, decreased alcohol intake, along with taking Qelbree for ADHD.  There has been a 15 pound weight gain since last exam.         Past Medical History:  Diagnosis Date   History of ADHD    Stopped taking medication when went into [employer]   Overweight     Patient Active Problem List   Diagnosis Date Noted   Sprain of right great toe 01/17/2021   Contusion of left foot 12/06/2020   Sprain of metatarsophalangeal joint of great toe 12/06/2020   Synovitis and tenosynovitis 12/06/2020   Rash 05/13/2019   ADD (attention deficit disorder) 05/25/2015    No past surgical history on file.  Prior to Admission medications   Medication Sig Start Date End Date Taking? Authorizing Provider  clobetasol ointment (TEMOVATE) 0.05 % SMARTSIG:Sparingly Topical Twice Daily 11/05/20  Yes [provider]  amphetamine-dextroamphetamine (ADDERALL) 20 MG tablet     [provider]  fluticasone (FLONASE) 50 MCG/ACT nasal spray Place into the nose. Patient not taking: Reported on 02/21/2023 11/26/19   [provider]  traMADol (ULTRAM) 50 MG tablet Take 50 mg by mouth every 6 (six) hours as needed. Patient not taking: Reported on 02/21/2023 01/05/22   [provider]  viloxazine ER (QELBREE) 200 MG 24 hr capsule Take 1 capsule (200 mg total) by mouth daily. Patient not taking: Reported on 02/21/2023 12/19/21   Joni Reining, PA-C    Allergies Patient has no known allergies.  Family History  Problem Relation Age of Onset   Crohn's disease Father      Social History Social History   Tobacco Use   Smoking status: Never   Smokeless tobacco: Former    Types: Chew    Quit date: 01/23/2022   Tobacco comments:    States I quit about a year ago - "I just quit cold [place]".  Vaping Use   Vaping Use: Never used  Substance Use Topics   Alcohol use: Not Currently    Review of Systems Constitutional: No fever/chills Eyes: No visual changes. ENT: No sore throat. Cardiovascular: Denies chest pain. Respiratory: Denies shortness of breath. Gastrointestinal: No abdominal pain.  No nausea, no vomiting.  No diarrhea.  No constipation. Genitourinary: Negative for dysuria. Musculoskeletal: Negative for back pain. Skin: Negative for rash. Neurological: Negative for headaches, focal weakness or numbness. Psychiatric: ADHD ____________________________________________   PHYSICAL EXAM: VITAL SIGNS: BP 118/75  BP Location Left Arm  Patient Position Sitting  Cuff Size Large  Pulse 76  Resp 12  Temp 97.1 F (36.2 C)  Temp src Temporal  SpO2 98 %  Weight 255 lb (115.7 kg)  Height 5\' 11"  (1.803 m)      Constitutional: Alert and oriented. Well appearing and in no acute distress. Eyes: Conjunctivae are normal. PERRL. EOMI. Head: Atraumatic. Nose: No congestion/rhinnorhea. Mouth/Throat: Mucous membranes are moist.  Oropharynx non-erythematous. Neck: No stridor.  No cervical spine tenderness to palpation. Hematological/Lymphatic/Immunilogical: No cervical lymphadenopathy. Cardiovascular: Normal rate, regular rhythm. Grossly normal heart sounds.  Good peripheral circulation. Respiratory: Normal respiratory effort.  No retractions.  Lungs CTAB. Gastrointestinal: Soft and nontender.  Distention secondary to body habitus.   No abdominal bruits. No CVA tenderness. Genitourinary: Deferred Musculoskeletal: No lower extremity tenderness nor edema.  No joint effusions. Neurologic:  Normal speech and language. No gross focal neurologic deficits  are appreciated. No gait instability. Skin:  Skin is warm, dry and intact. No rash noted. Psychiatric: Mood and affect are normal. Speech and behavior are normal.  ____________________________________________   LABS ___       Component Ref Range & Units 6 d ago (02/15/23) 1 yr ago (01/03/22) 1 yr ago (03/02/21) 3 yr ago (02/18/20)  Color, UA dark yellow Dark Amber Dark Yellow dark yellow  Clarity, UA clear Clear Clear clear  Glucose, UA Negative Negative Negative Negative Negative  Bilirubin, UA neg Negative Negative negative  Ketones, UA neg Negative Negative negative  Spec Grav, UA 1.010 - 1.025 1.015 >=1.030 Abnormal  1.015 1.025  Blood, UA neg Negative Negative negative  pH, UA 5.0 - 8.0 6.5 6.0 7.5 6.0  Protein, UA Negative Positive Abnormal  Negative Positive Abnormal  CM Negative  Comment: trace -+  Urobilinogen, UA 0.2 or 1.0 E.U./dL 0.2 0.2 0.2 0.2  Nitrite, UA neg Negative Negative negative  Leukocytes, UA Negative Negative Negative Negative Negative  Appearance dark     Odor   Strong               View All Conversations on this Encounter                       Component Ref Range & Units 6 d ago (02/15/23) 1 yr ago (01/03/22) 1 yr ago (07/19/21) 1 yr ago (03/02/21) 3 yr ago (02/18/20) 8 yr ago (09/23/14) 8 yr ago (09/23/14)  Glucose 70 - 99 mg/dL 89 88 409 High  88 R 89 R 89 R   Uric Acid 3.8 - 8.4 mg/dL 6.7 6.6 CM  7.1 CM 5.4 CM    Comment:            Therapeutic target for gout patients: <6.0  BUN 6 - 20 mg/dL 14 18 10 12 14 9  R   Creatinine, Ser 0.76 - 1.27 mg/dL 8.11 9.14 7.82 9.56 2.13 1.10 R   eGFR >59 mL/min/1.73 111 105 116 106     BUN/Creatinine Ratio 9 - 20 15 18 11 12 17     Sodium 134 - 144 mmol/L 140 138 137 139 139 137 R   Potassium 3.5 - 5.2 mmol/L 5.3 High  4.9 4.1 4.4 4.7 3.8 R   Chloride 96 - 106 mmol/L 101 99 99 100 103 101 R   Calcium 8.7 - 10.2 mg/dL 9.5 08.6 9.4 9.7 9.4 9.2 R   Phosphorus 2.8 - 4.1 mg/dL 3.0 3.0   3.2 3.0    Total Protein 6.0 - 8.5 g/dL 7.1 7.8 7.0 6.9 7.0 8.1 R   Albumin 4.3 - 5.2 g/dL 4.7 5.1 R 4.7 R 4.4 R 4.6 R 4.2 R   Globulin, Total 1.5 - 4.5 g/dL 2.4 2.7 2.3 2.5 2.4    Albumin/Globulin Ratio 1.2 - 2.2 2.0 1.9 2.0 1.8 1.9    Bilirubin Total 0.0 - 1.2 mg/dL 1.6 High  1.5 High  1.1 1.0 0.6 2.6 High  R   Alkaline Phosphatase 44 - 121 IU/L 69 69 63 66 55 R, CM 64 R, CM   LDH 121 - 224 IU/L 186 193  202 166    AST 0 - 40  IU/L 21 21 20 19 17 24  R   ALT 0 - 44 IU/L 17 19 20 16 17 29  R, CM   GGT 0 - 65 IU/L 12 13  9 10     Iron 38 - 169 ug/dL 295 621  308 80    Cholesterol, Total 100 - 199 mg/dL 657 846  962 952    Triglycerides 0 - 149 mg/dL 58 51  64 81    HDL >84 mg/dL 42 46  49 42    VLDL Cholesterol Cal 5 - 40 mg/dL 12 10  12 16     LDL Chol Calc (NIH) 0 - 99 mg/dL 132 High  440 High   102 High  79    Chol/HDL Ratio 0.0 - 5.0 ratio 3.9 3.7 CM  3.4 CM 3.3 CM    Comment:                                   T. Chol/HDL Ratio                                             Men  Women                               1/2 Avg.Risk  3.4    3.3                                   Avg.Risk  5.0    4.4                                2X Avg.Risk  9.6    7.1                                3X Avg.Risk 23.4   11.0  Estimated CHD Risk 0.0 - 1.0 times avg. 0.7 0.6 CM  0.5 CM  < 0.5 CM    Comment: The CHD Risk is based on the T. Chol/HDL ratio. Other factors affect CHD Risk such as hypertension, smoking, diabetes, severe obesity, and family history of premature CHD.  TSH 0.450 - 4.500 uIU/mL 1.200 2.100  1.990 2.390    T4, Total 4.5 - 12.0 ug/dL 6.7 6.7  6.7 6.7    T3 Uptake Ratio 24 - 39 % 31 32  32 34    Free Thyroxine Index 1.2 - 4.9 2.1 2.1  2.1 2.3    Prostate Specific Ag, Serum 0.0 - 4.0 ng/mL 0.5        Comment: Roche ECLIA methodology. According to the American Urological Association, Serum PSA should decrease and remain at undetectable levels after  radical prostatectomy. The AUA defines biochemical recurrence as an initial PSA value 0.2 ng/mL or greater followed by a subsequent confirmatory PSA value 0.2 ng/mL or greater. Values obtained with different assay methods or kits cannot be used interchangeably. Results cannot be interpreted as absolute evidence of the presence or absence of malignant disease.  WBC 3.4 - 10.8 x10E3/uL 4.8 7.2  6.7 6.3  14.2 High  R  RBC  4.14 - 5.80 x10E6/uL 5.39 5.71  5.38 5.52  5.49 R  Hemoglobin 13.0 - 17.7 g/dL 16.1 09.6  04.5 40.9    Hematocrit 37.5 - 51.0 % 47.2 49.2  46.6 48.8    MCV 79 - 97 fL 88 86  87 88  89 R  MCH 26.6 - 33.0 pg 30.2 30.6  29.6 29.7  29.9 R  MCHC 31.5 - 35.7 g/dL 81.1 91.4  78.2 95.6  21.3 R  RDW 11.6 - 15.4 % 12.6 12.4  12.7 12.4  13.1 R  Platelets 150 - 450 x10E3/uL 338 313  353 313  290 R  Neutrophils Not Estab. % 44 53  58 57  74.5 R  Lymphs Not Estab. % 41 36  31 30    Monocytes Not Estab. % 12 10  8 11     Eos Not Estab. % 2 1  2 1     Basos Not Estab. % 1 0  1 1    Neutrophils Absolute 1.4 - 7.0 x10E3/uL 2.2 3.8  4.0 3.6  10.5 High  R  Lymphocytes Absolute 0.7 - 3.1 x10E3/uL 2.0 2.6  2.1 1.9  2.1 R  Monocytes Absolute 0.1 - 0.9 x10E3/uL 0.6 0.7  0.5 0.7    EOS (ABSOLUTE) 0.0 - 0.4 x10E3/uL 0.1 0.1  0.1 0.1    Basophils Absolute 0.0 - 0.2 x10E3/uL 0.0 0.0  0.0 0.0  0.0 R  Immature Granulocytes Not Estab. % 0 0  0 0    Immature Grans (Abs)             _____________________     ____________________________________________   INITIAL IMPRESSION / ASSESSMENT AND PLAN   As part of my medical decision making, I reviewed the following data within the electronic MEDICAL RECORD NUMBER      No acute findings on physical exam and labs.        ____________________________________________   FINAL CLINICAL IMPRESSION  Well exam  ED Discharge Orders     None        Note:  This document was prepared using Dragon voice recognition software  and may include unintentional dictation errors.

## 2023-12-20 DIAGNOSIS — Z0000 Encounter for general adult medical examination without abnormal findings: Secondary | ICD-10-CM

## 2023-12-21 ENCOUNTER — Ambulatory Visit: Payer: Self-pay

## 2023-12-21 DIAGNOSIS — Z0000 Encounter for general adult medical examination without abnormal findings: Secondary | ICD-10-CM

## 2023-12-21 DIAGNOSIS — Z0110 Encounter for examination of ears and hearing without abnormal findings: Secondary | ICD-10-CM

## 2023-12-21 LAB — POCT URINALYSIS DIPSTICK
Bilirubin, UA: NEGATIVE
Blood, UA: NEGATIVE
Glucose, UA: NEGATIVE
Ketones, UA: NEGATIVE
Leukocytes, UA: NEGATIVE
Nitrite, UA: NEGATIVE
Protein, UA: POSITIVE — AB
Spec Grav, UA: 1.015 (ref 1.010–1.025)
Urobilinogen, UA: 0.2 U/dL
pH, UA: 6.5 (ref 5.0–8.0)

## 2023-12-21 NOTE — Progress Notes (Signed)
 Pt completed labs for physical. Gretel Acre

## 2023-12-22 LAB — CMP12+LP+TP+TSH+6AC+CBC/D/PLT
ALT: 21 IU/L (ref 0–44)
AST: 24 IU/L (ref 0–40)
Albumin: 4.6 g/dL (ref 4.3–5.2)
Alkaline Phosphatase: 63 IU/L (ref 44–121)
BUN/Creatinine Ratio: 15 (ref 9–20)
BUN: 13 mg/dL (ref 6–20)
Basophils Absolute: 0 10*3/uL (ref 0.0–0.2)
Basos: 1 %
Bilirubin Total: 1.1 mg/dL (ref 0.0–1.2)
Calcium: 9.5 mg/dL (ref 8.7–10.2)
Chloride: 101 mmol/L (ref 96–106)
Chol/HDL Ratio: 3.8 ratio (ref 0.0–5.0)
Cholesterol, Total: 173 mg/dL (ref 100–199)
Creatinine, Ser: 0.86 mg/dL (ref 0.76–1.27)
EOS (ABSOLUTE): 0.1 10*3/uL (ref 0.0–0.4)
Eos: 1 %
Estimated CHD Risk: 0.7 times avg. (ref 0.0–1.0)
Free Thyroxine Index: 2.2 (ref 1.2–4.9)
GGT: 11 IU/L (ref 0–65)
Globulin, Total: 2.5 g/dL (ref 1.5–4.5)
HDL: 45 mg/dL (ref >39)
Hematocrit: 47.8 % (ref 37.5–51.0)
Hemoglobin: 16.2 g/dL (ref 13.0–17.7)
Immature Grans (Abs): 0 10*3/uL (ref 0.0–0.1)
Immature Granulocytes: 0 %
Iron: 142 ug/dL (ref 38–169)
LDH: 185 IU/L (ref 121–224)
LDL Chol Calc (NIH): 115 mg/dL — ABNORMAL HIGH (ref 0–99)
Lymphocytes Absolute: 1.9 10*3/uL (ref 0.7–3.1)
Lymphs: 33 %
MCH: 29.7 pg (ref 26.6–33.0)
MCHC: 33.9 g/dL (ref 31.5–35.7)
MCV: 88 fL (ref 79–97)
Monocytes Absolute: 0.5 10*3/uL (ref 0.1–0.9)
Monocytes: 8 %
Neutrophils Absolute: 3.3 10*3/uL (ref 1.4–7.0)
Neutrophils: 57 %
Platelets: 341 10*3/uL (ref 150–450)
RBC: 5.45 x10E6/uL (ref 4.14–5.80)
RDW: 12.4 % (ref 11.6–15.4)
Sodium: 143 mmol/L (ref 134–144)
T3 Uptake Ratio: 32 % (ref 24–39)
T4, Total: 7 ug/dL (ref 4.5–12.0)
TSH: 1.83 u[IU]/mL (ref 0.450–4.500)
Total Protein: 7.1 g/dL (ref 6.0–8.5)
Triglycerides: 70 mg/dL (ref 0–149)
Uric Acid: 6.2 mg/dL (ref 3.8–8.4)
VLDL Cholesterol Cal: 13 mg/dL (ref 5–40)
WBC: 5.8 10*3/uL (ref 3.4–10.8)
eGFR: 121 mL/min/{1.73_m2} (ref >59)

## 2023-12-27 ENCOUNTER — Ambulatory Visit: Payer: Self-pay | Admitting: Physician Assistant

## 2023-12-27 ENCOUNTER — Encounter: Payer: Self-pay | Admitting: Physician Assistant

## 2023-12-27 ENCOUNTER — Other Ambulatory Visit: Payer: Self-pay

## 2023-12-27 VITALS — BP 109/79 | Temp 97.3°F | Resp 12 | Ht 71.0 in | Wt 253.0 lb

## 2023-12-27 DIAGNOSIS — L0100 Impetigo, unspecified: Secondary | ICD-10-CM

## 2023-12-27 DIAGNOSIS — Z0000 Encounter for general adult medical examination without abnormal findings: Secondary | ICD-10-CM

## 2023-12-27 MED ORDER — CLOBETASOL PROPIONATE 0.05 % EX OINT: TOPICAL_OINTMENT | Freq: Two times a day (BID) | CUTANEOUS | 2 refills | Status: AC

## 2023-12-27 NOTE — Progress Notes (Signed)
 Marland Kitchen

## 2023-12-27 NOTE — Progress Notes (Signed)
 [facility] ____________________________________________   None    (approximate)  I have reviewed the triage vital signs and the nursing notes.   HISTORY  Chief Complaint Annual Exam   HPI Russell Jacobs is a 28 y.o. male patient presents for annual physical exam.  Voices no concerns or complaints.  States discontinue "Qelbree" 1 month ago secondary to dress effects.  Stated noticed no side effects or increased ADD.         Past Medical History:  Diagnosis Date   History of ADHD    Stopped taking medication when went into [employer]   Overweight     Patient Active Problem List   Diagnosis Date Noted   Sprain of right great toe 01/17/2021   Contusion of left foot 12/06/2020   Sprain of metatarsophalangeal joint of great toe 12/06/2020   Synovitis and tenosynovitis 12/06/2020   Rash 05/13/2019   ADD (attention deficit disorder) 05/25/2015    No past surgical history on file.  Prior to Admission medications   Medication Sig Start Date End Date Taking? Authorizing Provider  amphetamine-dextroamphetamine (ADDERALL) 20 MG tablet     [provider]  clobetasol ointment (TEMOVATE) 0.05 % SMARTSIG:Sparingly Topical Twice Daily 11/05/20   [provider]  fluticasone (FLONASE) 50 MCG/ACT nasal spray Place into the nose. Patient not taking: Reported on 02/21/2023 11/26/19   [provider]  traMADol (ULTRAM) 50 MG tablet Take 50 mg by mouth every 6 (six) hours as needed. Patient not taking: Reported on 02/21/2023 01/05/22   [provider]  viloxazine ER (QELBREE) 200 MG 24 hr capsule Take 1 capsule (200 mg total) by mouth daily. Patient not taking: Reported on 02/21/2023 12/19/21   Joni Reining, PA-C    Allergies Patient has no known allergies.  Family History  Problem Relation Age of Onset   Crohn's disease Father     Social History Social History   Tobacco Use   Smoking status: Never    Smokeless tobacco: Former    Types: Chew    Quit date: 01/23/2022   Tobacco comments:    States I quit about a year ago - "I just quit cold [place]".  Vaping Use   Vaping status: Never Used  Substance Use Topics   Alcohol use: Not Currently    Review of Systems Constitutional: No fever/chills Eyes: No visual changes. ENT: No sore throat. Cardiovascular: Denies chest pain. Respiratory: Denies shortness of breath. Gastrointestinal: No abdominal pain.  No nausea, no vomiting.  No diarrhea.  No constipation. Genitourinary: Negative for dysuria. Musculoskeletal: Negative for back pain. Skin: Negative for rash. Neurological: Negative for headaches, focal weakness or numbness. Psychiatric: ADD  ____________________________________________   PHYSICAL EXAM:  VITAL SIGNS: P 109/79  BP Location Left Arm  Patient Position Sitting  Cuff Size Large  Temp 97.3 F (36.3 C)Temp. 97.3 F (36.3 C). Data is abnormal. Taken on 12/27/23 8:06 AM  Temp Source Temporal  Weight 253 lb (114.8 kg)  Height 5\' 11"  (1.803 m)  Resp 12  SpO2 98 %   Other Vitals   BMI: 35.29 kg/m2  BSA: 2.40 m2   Constitutional: Alert and oriented. Well appearing and in no acute distress. Eyes: Conjunctivae are normal. PERRL. EOMI. Head: Atraumatic. Nose: No congestion/rhinnorhea. Mouth/Throat: Mucous membranes are moist.  Oropharynx non-erythematous. Neck: No stridor.No cervical spine tenderness to palpation. Hematological/Lymphatic/Immunilogical: No cervical lymphadenopathy. Cardiovascular: Normal rate, regular rhythm. Grossly normal heart sounds.  Good peripheral circulation. Respiratory: Normal respiratory  effort.  No retractions. Lungs CTAB. Gastrointestinal: Soft and nontender. No distention. No abdominal bruits. No CVA tenderness. Genitourinary: Deferred Musculoskeletal: No lower extremity tenderness nor edema.  No joint effusions. Neurologic:  Normal speech and language. No gross focal neurologic  deficits are appreciated. No gait instability. Skin:  Skin is warm, dry and intact. No rash noted. Psychiatric: Mood and affect are normal. Speech and behavior are normal.  ____________________________________________   LABS         Component Ref Range & Units (hover) 6 d ago 10 mo ago 1 yr ago 2 yr ago 3 yr ago  Color, UA amber VC dark yellow Dark Amber Dark Yellow dark yellow  Clarity, UA clear clear Clear Clear clear  Glucose, UA Negative Negative Negative Negative Negative  Bilirubin, UA neg neg Negative Negative negative  Ketones, UA neg neg Negative Negative negative  Spec Grav, UA 1.015 1.015 >=1.030 Abnormal  1.015 1.025  Blood, UA neg neg Negative Negative negative  pH, UA 6.5 6.5 6.0 7.5 6.0  Protein, UA Positive Abnormal  Positive Abnormal  CM Negative Positive Abnormal  CM Negative  Comment: trace -+  Urobilinogen, UA 0.2 0.2 0.2 0.2 0.2  Nitrite, UA neg neg Negative Negative negative  Leukocytes, UA Negative Negative Negative Negative Negative  Appearance  dark     Odor    Strong              View All Conversations on this Encounter                Component Ref Range & Units (hover) 6 d ago (12/21/23) 10 mo ago (02/15/23) 1 yr ago (01/03/22) 2 yr ago (07/19/21) 2 yr ago (03/02/21) 3 yr ago (02/18/20) 9 yr ago (09/23/14) 9 yr ago (09/23/14)  Glucose CANCELED 89 R 88 R 126 High  R 88 R 89 R 89 R   Comment: Test not performed.  Serum was in contact with cells when received which will make the result inaccurate.  Result canceled by the ancillary.  Uric Acid 6.2 6.7 CM 6.6 CM  7.1 CM 5.4 CM    Comment:            Therapeutic target for gout patients: <6.0  BUN 13 14 18 10 12 14 9  R   Creatinine, Ser 0.86 0.96 1.01 0.93 1.00 0.83 1.10 R   eGFR 121 111 105 116 106     BUN/Creatinine Ratio 15 15 18 11 12 17     Sodium 143 140 138 137 139 139 137 R   Potassium CANCELED 5.3 High  R 4.9 R 4.1 R 4.4 R 4.7 R 3.8 R   Comment: Test not performed.  Serum was in  contact with cells when received which will make the result inaccurate.  Result canceled by the ancillary.  Chloride 101 101 99 99 100 103 101 R   Calcium 9.5 9.5 10.2 9.4 9.7 9.4 9.2 R   Phosphorus CANCELED 3.0 R 3.0 R  3.2 R 3.0 R    Comment: Test not performed.  Serum was in contact with cells when received which will make the result inaccurate.  Result canceled by the ancillary.  Total Protein 7.1 7.1 7.8 7.0 6.9 7.0 8.1 R   Albumin 4.6 4.7 5.1 R 4.7 R 4.4 R 4.6 R 4.2 R   Globulin, Total 2.5 2.4 2.7 2.3 2.5 2.4    Bilirubin Total 1.1 1.6 High  1.5 High  1.1 1.0 0.6 2.6 High  R  Alkaline Phosphatase 63 69 69 63 66 55 R, CM 64 R, CM   LDH 185 186 193  202 166    AST 24 21 21 20 19 17 24  R   ALT 21 17 19 20 16 17 29  R, CM   GGT 11 12 13  9 10     Iron 142 120 137  105 80    Cholesterol, Total 173 165 170  167 137    Triglycerides 70 58 51  64 81    HDL 45 42 46  49 42    VLDL Cholesterol Cal 13 12 10  12 16     LDL Chol Calc (NIH) 115 High  111 High  114 High   106 High  79    Chol/HDL Ratio 3.8 3.9 CM 3.7 CM  3.4 CM 3.3 CM    Comment:                                   T. Chol/HDL Ratio                                             Men  Women                               1/2 Avg.Risk  3.4    3.3                                   Avg.Risk  5.0    4.4                                2X Avg.Risk  9.6    7.1                                3X Avg.Risk 23.4   11.0  Estimated CHD Risk 0.7 0.7 CM 0.6 CM  0.5 CM  < 0.5 CM    Comment: The CHD Risk is based on the T. Chol/HDL ratio. Other factors affect CHD Risk such as hypertension, smoking, diabetes, severe obesity, and family history of premature CHD.  TSH 1.830 1.200 2.100  1.990 2.390    T4, Total 7.0 6.7 6.7  6.7 6.7    T3 Uptake Ratio 32 31 32  32 34    Free Thyroxine Index 2.2 2.1 2.1  2.1 2.3    WBC 5.8 4.8 7.2  6.7 6.3  14.2 High  R  RBC 5.45 5.39 5.71  5.38 5.52  5.49 R  Hemoglobin 16.2 16.3 17.5  15.9 16.4    Hematocrit  47.8 47.2 49.2  46.6 48.8    MCV 88 88 86  87 88  89 R  MCH 29.7 30.2 30.6  29.6 29.7  29.9 R  MCHC 33.9 34.5 35.6  34.1 33.6  33.4 R  RDW 12.4 12.6 12.4  12.7 12.4  13.1 R  Platelets 341 338 313  353 313  290 R  Neutrophils 57 44 53  58 57  74.5 R  Lymphs 33 41 36  31 30  Monocytes 8 12 10  8 11     Eos 1 2 1  2 1     Basos 1 1 0  1 1    Neutrophils Absolute 3.3 2.2 3.8  4.0 3.6  10.5 High  R  Lymphocytes Absolute 1.9 2.0 2.6  2.1 1.9  2.1 R  Monocytes Absolute 0.5 0.6 0.7  0.5 0.7    EOS (ABSOLUTE) 0.1 0.1 0.1  0.1 0.1    Basophils Absolute 0.0 0.0 0.0  0.0 0.0  0.0 R  Immature Granulocytes 0 0 0  0 0    Immature Grans (Abs) 0.0 0.0 0.0  0.0 0.0                ____________________________________________    ____________________________________________   INITIAL IMPRESSION / ASSESSMENT AND PLAN  As part of my medical decision making, I reviewed the following data within the electronic MEDICAL RECORD NUMBER Notes from prior ED visits and [place] Controlled Substance Database      No acute findings on physical exam.  Labs continue to show asymptomatic proteinuria.     ____________________________________________   FINAL CLINICAL IMPRESSION Well exam   ED Discharge Orders     None        Note:  This document was prepared using Dragon voice recognition software and may include unintentional dictation errors.

## 2024-04-16 DIAGNOSIS — B078 Other viral warts: Secondary | ICD-10-CM | POA: Diagnosis not present
